# Patient Record
Sex: Female | Born: 1950 | Race: Black or African American | Hispanic: No | Marital: Single | Smoking: Former smoker
Health system: Southern US, Community
[De-identification: ages and names within clinical notes are randomized; demographics above are authoritative.]

## PROBLEM LIST (undated history)

## (undated) DIAGNOSIS — L0390 Cellulitis, unspecified: Secondary | ICD-10-CM

## (undated) DIAGNOSIS — I10 Essential (primary) hypertension: Secondary | ICD-10-CM

## (undated) DIAGNOSIS — E119 Type 2 diabetes mellitus without complications: Secondary | ICD-10-CM

## (undated) DIAGNOSIS — E7800 Pure hypercholesterolemia, unspecified: Secondary | ICD-10-CM

## (undated) DIAGNOSIS — D649 Anemia, unspecified: Secondary | ICD-10-CM

## (undated) DIAGNOSIS — I509 Heart failure, unspecified: Secondary | ICD-10-CM

## (undated) HISTORY — DX: Anemia, unspecified: D64.9

## (undated) HISTORY — DX: Heart failure, unspecified: I50.9

---

## 2005-06-18 ENCOUNTER — Emergency Department: Payer: Self-pay | Admitting: Unknown Physician Specialty

## 2005-06-27 ENCOUNTER — Emergency Department: Payer: Self-pay | Admitting: Unknown Physician Specialty

## 2005-06-28 ENCOUNTER — Ambulatory Visit: Payer: Self-pay | Admitting: Unknown Physician Specialty

## 2008-03-10 ENCOUNTER — Ambulatory Visit: Payer: Self-pay

## 2010-01-23 ENCOUNTER — Emergency Department: Payer: Self-pay | Admitting: Emergency Medicine

## 2010-04-21 ENCOUNTER — Ambulatory Visit: Payer: Self-pay | Admitting: Family Medicine

## 2011-11-03 ENCOUNTER — Ambulatory Visit: Payer: PRIVATE HEALTH INSURANCE | Primary: DO

## 2011-11-03 ENCOUNTER — Ambulatory Visit: Admit: 2011-11-03 | Payer: PRIVATE HEALTH INSURANCE | Primary: DO

## 2011-11-03 MED ORDER — midazolam (VERSED) 5 mg/mL injection
5 | INTRAMUSCULAR | Status: AC
Start: 2011-11-03 — End: ?

## 2011-11-03 MED ORDER — bupivacaine-EPINEPHrine 0.5 %-1:200,000 injection
0.5 | INTRAMUSCULAR | Status: DC | PRN
Start: 2011-11-03 — End: 2011-11-03
  Administered 2011-11-03: 21:00:00 0.5 %-1:200,000 via SUBCUTANEOUS

## 2011-11-03 MED ORDER — HYDROmorphone (DILAUDID) 1 mg/mL syringe
1 | INTRAMUSCULAR | Status: DC | PRN
Start: 2011-11-03 — End: 2011-11-03
  Administered 2011-11-03: 20:00:00 1 mg/mL via INTRAVENOUS

## 2011-11-03 MED ORDER — remifentanil (ULTIVA) 2,500 mcg in sodium chloride 0.9% (NS) 50 mL infusion
1 | INTRAVENOUS | Status: DC | PRN
Start: 2011-11-03 — End: 2011-11-03
  Administered 2011-11-03 (×2): via INTRAVENOUS

## 2011-11-03 MED ORDER — HYDROmorphone (DILAUDID) 1 mg/mL syringe 0.4-0.8 mg
1 | INTRAMUSCULAR | Status: DC | PRN
Start: 2011-11-03 — End: 2011-11-03

## 2011-11-03 MED ORDER — diphenhydrAMINE (BENADRYL) injection 25 mg
50 | INTRAMUSCULAR | Status: DC | PRN
Start: 2011-11-03 — End: 2011-11-03

## 2011-11-03 MED ORDER — propofol (DIPRIVAN) injection
10 | INTRAVENOUS | Status: DC | PRN
Start: 2011-11-03 — End: 2011-11-03
  Administered 2011-11-03 (×2): 10 mg/mL via INTRAVENOUS

## 2011-11-03 MED ORDER — ondansetron (ZOFRAN) 4 mg/2 mL injection 4 mg
4 | Freq: Four times a day (QID) | INTRAMUSCULAR | Status: DC | PRN
Start: 2011-11-03 — End: 2011-11-03
  Administered 2011-11-03: 22:00:00 4 mg via INTRAVENOUS

## 2011-11-03 MED ORDER — ondansetron (ZOFRAN) 4 mg/2 mL injection
4 | INTRAMUSCULAR | Status: AC
Start: 2011-11-03 — End: ?

## 2011-11-03 MED ORDER — fentaNYL (SUBLIMAZE) 50 mcg/mL injection
50 | INTRAMUSCULAR | Status: AC
Start: 2011-11-03 — End: ?

## 2011-11-03 MED ORDER — fentaNYL (SUBLIMAZE) 50 mcg/mL injection
50 | INTRAMUSCULAR | Status: DC | PRN
Start: 2011-11-03 — End: 2011-11-03
  Administered 2011-11-03 (×2): 50 mcg/mL via INTRAVENOUS

## 2011-11-03 MED ORDER — ondansetron (ZOFRAN) 4 mg/2 mL injection
4 | INTRAMUSCULAR | Status: DC | PRN
Start: 2011-11-03 — End: 2011-11-03
  Administered 2011-11-03: 20:00:00 4 mg/2 mL via INTRAVENOUS

## 2011-11-03 MED ORDER — Normosol-R infusion 1,000 mL
INTRAVENOUS | Status: DC
Start: 2011-11-03 — End: 2011-11-03
  Administered 2011-11-03: 16:00:00 via INTRAVENOUS

## 2011-11-03 MED ORDER — sodium chloride 0.9% 0.9 % irrigation
0.9 | Status: DC | PRN
Start: 2011-11-03 — End: 2011-11-03
  Administered 2011-11-03: 20:00:00 0.9 % irrigation

## 2011-11-03 MED ORDER — ceFAZolin (ANCEF) IV syringe 1 g
1 | INTRAVENOUS | Status: DC | PRN
Start: 2011-11-03 — End: 2011-11-03
  Administered 2011-11-03: 20:00:00 1 gram/0 mL via INTRAVENOUS

## 2011-11-03 MED ORDER — lidocaine 20 mg/mL (2 %) injection
20 | INTRAMUSCULAR | Status: DC | PRN
Start: 2011-11-03 — End: 2011-11-03
  Administered 2011-11-03: 20:00:00 20 mg/mL (2 %) via INTRAVENOUS

## 2011-11-03 MED ORDER — HYDROmorphone (DILAUDID) 1 mg/mL syringe 0.2-0.4 mg
1 | INTRAMUSCULAR | Status: DC | PRN
Start: 2011-11-03 — End: 2011-11-03

## 2011-11-03 MED ORDER — fentaNYL citrate (PF) (SUBLIMAZE) 100 mcg/2 mL (50 mcg/mL) syringe 50-100 mcg
100 | INTRAVENOUS | Status: DC | PRN
Start: 2011-11-03 — End: 2011-11-03
  Administered 2011-11-03 (×4): 100 ug via INTRAVENOUS

## 2011-11-03 MED ORDER — Normosol-R infusion
INTRAVENOUS | Status: DC | PRN
Start: 2011-11-03 — End: 2011-11-03
  Administered 2011-11-03: 19:00:00 via INTRAVENOUS

## 2011-11-03 MED ORDER — meperidine (PF) (DEMEROL) syringe 12.5 mg
50 | INTRAMUSCULAR | Status: DC | PRN
Start: 2011-11-03 — End: 2011-11-03

## 2011-11-03 MED ORDER — dexamethasone (DECADRON) injection
10 | INTRAMUSCULAR | Status: DC | PRN
Start: 2011-11-03 — End: 2011-11-03
  Administered 2011-11-03: 20:00:00 10 mg/mL via INTRAVENOUS

## 2011-11-03 MED ORDER — propofol (DIPRIVAN) injection
10 | INTRAVENOUS | Status: DC | PRN
Start: 2011-11-03 — End: 2011-11-03
  Administered 2011-11-03: 20:00:00 10 mg/mL via INTRAVENOUS

## 2011-11-03 MED ORDER — midazolam (VERSED) injection
5 | INTRAMUSCULAR | Status: DC | PRN
Start: 2011-11-03 — End: 2011-11-03
  Administered 2011-11-03: 19:00:00 5 mg/mL via INTRAVENOUS

## 2011-11-03 MED ORDER — fentaNYL (SUBLIMAZE) 50 mcg/mL injection 25-50 mcg
50 | INTRAMUSCULAR | Status: DC | PRN
Start: 2011-11-03 — End: 2011-11-03

## 2011-11-03 MED FILL — MIDAZOLAM 5 MG/ML INJECTION SOLUTION: 5 mg/mL | INTRAMUSCULAR | Qty: 1

## 2011-11-03 MED FILL — FENTANYL (PF) 50 MCG/ML INJECTION SOLUTION: 50 ug/mL | INTRAMUSCULAR | Qty: 2

## 2011-11-03 MED FILL — ONDANSETRON HCL (PF) 4 MG/2 ML INJECTION SOLUTION: 4 | INTRAMUSCULAR | Qty: 2

## 2012-04-04 ENCOUNTER — Ambulatory Visit: Payer: Self-pay | Admitting: Family Medicine

## 2013-04-07 ENCOUNTER — Ambulatory Visit: Payer: Self-pay

## 2013-08-07 ENCOUNTER — Ambulatory Visit: Payer: Self-pay | Admitting: Emergency Medicine

## 2013-08-07 ENCOUNTER — Inpatient Hospital Stay: Payer: Self-pay | Admitting: Internal Medicine

## 2013-08-07 LAB — BASIC METABOLIC PANEL
ANION GAP: 7 (ref 7–16)
BUN: 18 mg/dL (ref 7–18)
CALCIUM: 9.4 mg/dL (ref 8.5–10.1)
CHLORIDE: 100 mmol/L (ref 98–107)
CO2: 28 mmol/L (ref 21–32)
Creatinine: 0.93 mg/dL (ref 0.60–1.30)
EGFR (Non-African Amer.): >60
Glucose: 225 mg/dL — ABNORMAL HIGH (ref 65–99)
OSMOLALITY: 279 (ref 275–301)
Potassium: 3.6 mmol/L (ref 3.5–5.1)
Sodium: 135 mmol/L — ABNORMAL LOW (ref 136–145)

## 2013-08-07 LAB — CBC WITH DIFFERENTIAL/PLATELET
Basophil #: 0.2 10*3/uL — ABNORMAL HIGH (ref 0.0–0.1)
Basophil %: 1.3 %
EOS PCT: 1.4 %
Eosinophil #: 0.2 10*3/uL (ref 0.0–0.7)
HCT: 43.2 % (ref 35.0–47.0)
HGB: 13.4 g/dL (ref 12.0–16.0)
LYMPHS PCT: 19.2 %
Lymphocyte #: 2.3 10*3/uL (ref 1.0–3.6)
MCH: 23.1 pg — AB (ref 26.0–34.0)
MCHC: 31.1 g/dL — AB (ref 32.0–36.0)
MCV: 74 fL — ABNORMAL LOW (ref 80–100)
MONO ABS: 0.7 x10 3/mm (ref 0.2–0.9)
MONOS PCT: 5.6 %
Neutrophil #: 8.7 10*3/uL — ABNORMAL HIGH (ref 1.4–6.5)
Neutrophil %: 72.5 %
PLATELETS: 407 10*3/uL (ref 150–440)
RBC: 5.83 10*6/uL — AB (ref 3.80–5.20)
RDW: 14.6 % — AB (ref 11.5–14.5)
WBC: 12 10*3/uL — ABNORMAL HIGH (ref 3.6–11.0)

## 2013-08-08 LAB — CBC WITH DIFFERENTIAL/PLATELET
Basophil #: 0.1 10*3/uL (ref 0.0–0.1)
Basophil %: 1.1 %
EOS PCT: 0.9 %
Eosinophil #: 0.1 10*3/uL (ref 0.0–0.7)
HCT: 37.4 % (ref 35.0–47.0)
HGB: 12 g/dL (ref 12.0–16.0)
LYMPHS ABS: 1.7 10*3/uL (ref 1.0–3.6)
Lymphocyte %: 14 %
MCH: 23.7 pg — ABNORMAL LOW (ref 26.0–34.0)
MCHC: 32.2 g/dL (ref 32.0–36.0)
MCV: 74 fL — ABNORMAL LOW (ref 80–100)
MONO ABS: 1.1 x10 3/mm — AB (ref 0.2–0.9)
Monocyte %: 9.1 %
Neutrophil #: 8.8 10*3/uL — ABNORMAL HIGH (ref 1.4–6.5)
Neutrophil %: 74.9 %
Platelet: 322 10*3/uL (ref 150–440)
RBC: 5.09 10*6/uL (ref 3.80–5.20)
RDW: 14.4 % (ref 11.5–14.5)
WBC: 11.8 10*3/uL — AB (ref 3.6–11.0)

## 2013-08-08 LAB — BASIC METABOLIC PANEL
Anion Gap: 5 — ABNORMAL LOW (ref 7–16)
BUN: 17 mg/dL (ref 7–18)
CREATININE: 0.77 mg/dL (ref 0.60–1.30)
Calcium, Total: 8.3 mg/dL — ABNORMAL LOW (ref 8.5–10.1)
Chloride: 102 mmol/L (ref 98–107)
Co2: 29 mmol/L (ref 21–32)
EGFR (Non-African Amer.): >60
GLUCOSE: 144 mg/dL — AB (ref 65–99)
OSMOLALITY: 276 (ref 275–301)
Potassium: 3.8 mmol/L (ref 3.5–5.1)
Sodium: 136 mmol/L (ref 136–145)

## 2013-08-08 LAB — HEMOGLOBIN A1C: HEMOGLOBIN A1C: 7.4 % — AB (ref 4.2–6.3)

## 2013-08-08 LAB — TSH: THYROID STIMULATING HORM: 1.22 u[IU]/mL

## 2013-08-10 LAB — VANCOMYCIN, TROUGH: Vancomycin, Trough: 8 ug/mL — ABNORMAL LOW (ref 10–20)

## 2013-08-10 LAB — BASIC METABOLIC PANEL
Anion Gap: 4 — ABNORMAL LOW (ref 7–16)
BUN: 15 mg/dL (ref 7–18)
Calcium, Total: 8.6 mg/dL (ref 8.5–10.1)
Chloride: 105 mmol/L (ref 98–107)
Co2: 28 mmol/L (ref 21–32)
Creatinine: 0.8 mg/dL (ref 0.60–1.30)
EGFR (African American): >60
EGFR (Non-African Amer.): >60
Glucose: 125 mg/dL — ABNORMAL HIGH (ref 65–99)
Osmolality: 276 (ref 275–301)
POTASSIUM: 3.9 mmol/L (ref 3.5–5.1)
Sodium: 137 mmol/L (ref 136–145)

## 2013-08-12 LAB — CULTURE, BLOOD (SINGLE)

## 2013-08-15 LAB — WOUND CULTURE

## 2014-10-10 NOTE — Consult Note (Signed)
PATIENT NAME:  [name], [name] MR#:  [id number] DATE OF BIRTH:  1951/01/18  DATE OF CONSULTATION:  08/09/2013  CONSULTING PHYSICIAN:  Harrell Gave A. Orlan Aversa, MD  REASON FOR CONSULTATION: Right lower extremity swelling and cellulitis.   HISTORY OF PRESENT ILLNESS: Robin [name] is a pleasant 64 year old female with history of diabetes and high blood pressure, who has been noticing skin changes in her right lower extremity over the last few months. She said that over the last week or so, had skin breakdown, bullae, redness and increased swelling. Was doing fine prior to this except for the swelling. No fevers, chills, night sweats, shortness of breath, cough, chest pain, abdominal pain, nausea, vomiting, diarrhea, constipation, dysuria or hematuria.   PAST MEDICAL HISTORY:  1. Hypertension.  2. Diabetes mellitus.   HOME MEDICATIONS: Lisinopril, iron sulfate and aspirin.   ALLERGIES: No known drug allergies.   SOCIAL HISTORY: Smokes 1 pack per day. Drinks alcohol occasionally. Lives with daughter and mother.   FAMILY HISTORY: Mother with diabetes and hypertension.   REVIEW OF SYSTEMS: A 12-point review of systems was obtained. Pertinent positives and negatives as above.   PHYSICAL EXAMINATION:  VITAL SIGNS: Temperature 97.7, pulse 85, blood pressure 100/67, respirations 18, 98% on room air.  GENERAL: In no acute distress. Alert and oriented x3.  HEAD: Normocephalic, atraumatic.  EYES: No scleral icterus. No conjunctivitis.  FACE: No obvious facial trauma. Normal external nose. Normal external ears.  CHEST: Lungs clear to auscultation. Moving air well.  HEART: Regular rate and rhythm. No murmurs, rubs or gallops.  ABDOMEN: Soft, nontender, nondistended.  EXTREMITIES: Moves all extremities well. Does have right lower extremity which is approximately 2 times larger with swelling to the mid lower leg down to the foot. There are some skin changes associated with chronic venous stasis. There is also  some cellulitis and some small areas with some desquamation, but no obvious fluid collection.  NEUROLOGIC: Cranial nerves II through XII grossly intact.   LABORATORY DATA: White cell count of 11.8, was 12.0 at admission, with 75% neutrophils.   IMAGING: CT of lower extremity shows diffuse subcutaneous soft tissue stranding. No obvious fluid collections.   ASSESSMENT AND PLAN: Robin [name] is a pleasant 64 year old female with worsening right lower extremity swelling and cellulitis. No obvious surgical fluid collections to drain. Will continue to follow.   ____________________________ Glena Norfolk Bridger Pizzi, MD cal:lb D: 08/09/2013 12:13:57 ET T: 08/09/2013 12:33:46 ET JOB#: [id number]  cc: Harrell Gave A. August Gosser, MD, <Dictator> Floyde Parkins MD ELECTRONICALLY SIGNED 08/10/2013 13:13

## 2014-10-10 NOTE — H&P (Signed)
PATIENT NAME:  [name], [name] MR#:  [id number] DATE OF BIRTH:  03-11-1951  DATE OF ADMISSION:  08/07/2013  PRIMARY CARE PHYSICIAN:  Dr. [facility].   CHIEF COMPLAINT:  Skin breakdown and pain in the right lower extremity.   HISTORY OF PRESENT ILLNESS:  Robin [name] is a 64 year old African American female with a history of diabetes mellitus, hypertension, has been experiencing discoloration of the skin for the last 2 to 3 months in the right lower extremity.  Concerning this, went to the primary care physician and was given clotrimazole without much improvement.  Started to notice worsening of the skin breakdown with some small bullae.  Concerning this, went back to the primary care physician who referred her to the Emergency Department.  The patient also has a rash all over the body with extensive itching.  Denies having any fever.  Unsure about how well the patient's diabetes is controlled.   PAST MEDICAL HISTORY: 1.  Hypertension.  2.  Diabetes mellitus.   PAST SURGICAL HISTORY:  None.   ALLERGIES:  No known drug allergies.   HOME MEDICATIONS: 1.  Lisinopril, hydrochlorothiazide 10/25 mg once a day.  2.  Ferrous sulfate 325 mg once a daily. 3.  Aspirin 81 mg daily.   SOCIAL HISTORY:  Continues to smoke 1 pack a day.  Drinks alcohol occasionally.  Denies using any illicit drugs.  Lives with her daughter and mother.  Works at [employer].   FAMILY HISTORY:  Mother with diabetes mellitus and hypertension.   REVIEW OF SYSTEMS: CONSTITUTIONAL:  Denies any generalized weakness.  EYES:  No change in vision.  EARS, NOSE, THROAT:  No change in hearing.  RESPIRATORY:  No cough or shortness of breath.  CARDIOVASCULAR:  No chest pain or palpation.  GASTROINTESTINAL:  No nausea, vomiting.  GENITOURINARY:  No dysuria or hematuria.  ENDOCRINE:  Has a diagnosis of diabetes mellitus.  HEMATOLOGY:  No easy bruising or bleeding.  SKIN:  Extensive rash all over the body, worse in the lower  extremities, abdomen, under both upper extremities.  MUSCULOSKELETAL:  No joint pains and aches.  NEUROLOGIC:  No weakness or numbness in any part of the body.   PHYSICAL EXAMINATION: GENERAL:  This is a well-built, well-nourished, age-appropriate female lying down in the bed, not in distress.  VITAL SIGNS:  Temperature 97.9, pulse 98, blood pressure 95/54, respiratory rate of 16, oxygen saturation is 97% on room air.  HEENT:  Head normocephalic, atraumatic.  Eyes, no scleral icterus.  Conjunctivae normal.  Pupils equal and reactive.  Extraocular movements are intact.  Mucous membranes moist.  No pharyngeal erythema.  NECK:  Supple.  No lymphadenopathy.  No JVD.  No carotid bruit.  No thyromegaly.  CHEST:  Has no focal tenderness.  LUNGS:  Bilaterally clear to auscultation.  HEART:  S1, S2 regular.  No murmurs are heard.  ABDOMEN:  Bowel sounds plus.  Soft, nontender, nondistended.  EXTREMITIES:  Right lower extremity with hyperkeratotic area of the ankle with multiple fissures, increased swelling and redness of the lower part of the leg.  Multiple extensive rash all over the body concerning about scabies.   NEUROLOGIC:  The patient is alert, oriented to place, person and time.  Cranial nerves II through XII intact.  Motor 5 by 5 in upper and lower extremities.  MUSCULOSKELETAL:  Good range of motion in all the extremities.   LABORATORY DATA:  CBC, WBC of 12, hemoglobin 13.4, platelet count of 404.   CT of  the ankle, diffuse subcutaneous soft tissue stranding suggestive of cellulitis.     BMP shows a glucose of 225, the rest of all the values are within normal limits.   ASSESSMENT AND PLAN:  Robin [name] is a 64 year old female who comes to the Emergency Department with cellulitis of the right lower extremity.  1.  Cellulitis of the right lower extremity, concerned about from the scratching.  Concerned about the patient has underlying scabies.  However, we will obtain dermatology consult.  We  will continue with vancomycin and Zosyn for now.  We will also give her one dose of  lotion all over the body.  We will also check the HIV status.  2.  Diabetes mellitus.  We will obtain hemoglobin A1c.  Keep the patient on sliding scale insulin.  3.  Hypertension:  Currently the patient has low-normal blood pressure.  Hold lisinopril, hydrochlorothiazide.  4.  Keep the patient on deep vein thrombosis prophylaxis with Lovenox.   TIME SPENT:  50 minutes.     ____________________________ Monica Becton, MD pv:ea D: 08/08/2013 00:04:51 ET T: 08/08/2013 00:40:06 ET JOB#: [id number]  cc: Monica Becton, MD, <Dictator> Monica Becton MD ELECTRONICALLY SIGNED 08/21/2013 1:24

## 2014-10-10 NOTE — Discharge Summary (Signed)
PATIENT NAME:  [name], [name] MR#:  [name] DATE OF BIRTH:  [birth year]  DATE OF ADMISSION:  08/07/2013  DATE OF DISCHARGE:  08/10/2013  ADMISSION DIAGNOSIS: Cellulitis of the right lower extremity.   DISCHARGE DIAGNOSES:  1.  Cellulitis of the right lower extremity.  2.  Diabetes.  3.  Hypertension.   CONSULTATIONS: Dr. Rexene Edison from Surgery.   IMAGING: The patient had a CT scan which showed evidence of cellulitis. No evidence of osteomyelitis.   LABS:  Discharge sodium 137, potassium 3.9, chloride 105, bicarb 28, BUN 15, creatinine 0.80, glucose 125. TSH 1.22. White blood cells 11, hemoglobin 12, hematocrit 37.4, platelets 324. Blood cultures negative to date. Hemoglobin A1c is 7.4.  HOSPITAL COURSE: This is a very pleasant, 64 year old female who presented with cellulitis of her right lower extremity. For further details, please refer to the H and P.   1.  Cellulitis. Her initial CT did not show evidence of osteomyelitis. I did consult Surgery for their evaluation and opinion. At this time, she has no surgical needs or indications for surgery. The cellulitis has somewhat improved, with decreasing erythema, decrease in swelling. She still has some swelling of that right lower extremity. There are some notable skin changes associated with the chronic venous stasis as well, and there is a small area of desquamation, but no obvious fluid collection at discharge. She was transitioned from Zosyn to Bactrim and Keflex at discharge.   2.  Diabetes. The patient has a history of diabetes, but apparently not on any medications. Her A1c is 7.4. I started her on glipizide. She will need outpatient follow up at the [facility] clinic. We have also consulted the home health nurse to help with her diabetes management.   3. Hypertension. Her blood pressures were actually low-normal here, so we stopped her medications. This needs to be followed up by her primary care physician as well as the home health  nurse.   4.  Scabies. It was thought that patient may have some scabies on her arms. She should see a dermatologist. We recommend that she see a dermatologist on Monday.   DISCHARGE MEDICATIONS: 1.  Aspirin 81 mg daily.  2.  Keflex 500 mg q. 8 hours x 14 days.  3.  Bactrim 1 tablet p.o. b.i.d. x 14 days. 4.  Glipizide 5 mg daily for diabetes.  5.  Santyl apply to affected area daily for her wound. 6.  Ferrous sulfate 325 mg daily.   INSTRUCTIONS: Discharge home with home health. Discharge activity as tolerated.  Discharge diet:  Low sodium ADA diet. Follow up in one week with [facility] for diabetes and blood pressure control.   TIME SPENT:  35 minutes.  The patient was medically stable for discharge.     ____________________________ Weaver Tweed P. Benjie Karvonen, MD spm:mr D: 08/10/2013 13:22:00 ET T: 08/10/2013 19:54:18 ET JOB#: [id number]  cc: Jiselle Sheu P. Benjie Karvonen, MD, <Dictator> Randall P Elliona Doddridge MD ELECTRONICALLY SIGNED 08/10/2013 22:10

## 2015-12-01 ENCOUNTER — Ambulatory Visit
Admission: RE | Admit: 2015-12-01 | Discharge: 2015-12-01 | Disposition: A | Discharge status: Home / Self Care | Payer: Self-pay | Source: Ambulatory Visit | Attending: Internal Medicine | Admitting: Internal Medicine

## 2015-12-01 ENCOUNTER — Ambulatory Visit: Payer: Self-pay | Attending: Internal Medicine

## 2015-12-01 VITALS — Ht 59.06 in | Wt 199.3 lb

## 2015-12-01 DIAGNOSIS — Z0000 Encounter for general adult medical examination without abnormal findings: Secondary | ICD-10-CM

## 2015-12-01 NOTE — Progress Notes (Signed)
Subjective:     Patient ID: Robin [name], female   DOB: [birth year], 65 y.o.   MRN: [id number]  HPI   Review of Systems     Objective:   Physical Exam  Pulmonary/Chest: Right breast exhibits no inverted nipple, no mass, no nipple discharge, no skin change and no tenderness. Left breast exhibits no inverted nipple, no mass, no nipple discharge, no skin change and no tenderness. Breasts are asymmetrical.  Left breast smaller than right       Assessment:     65 year old patient presents for [facility] clinic visit .  Patient screened, and meets BCCCP eligibility.  Patient does not have insurance, Medicare or Medicaid.  Handout given on Affordable Care Act.  Instructed patient on breast self-exam using teach back method.  CBE unremarkable.  No mass or lump palpated. Patient reports her older sister was diagnosed with breast cancer in her early 37's, and is deceased.      Plan:  Sent for bilateral screening mammogram.

## 2015-12-07 NOTE — Progress Notes (Signed)
Letter mailed from [facility] to notify of normal mammogram results.  Patient to return in one year for annual screening.  Copy to [facility]. 

## 2016-06-22 ENCOUNTER — Ambulatory Visit (INDEPENDENT_AMBULATORY_CARE_PROVIDER_SITE_OTHER): Payer: Self-pay

## 2016-06-22 ENCOUNTER — Ambulatory Visit
Admission: EM | Admit: 2016-06-22 | Discharge: 2016-06-22 | Disposition: A | Discharge status: Home / Self Care | Payer: Self-pay | Attending: Family Medicine | Admitting: Family Medicine

## 2016-06-22 DIAGNOSIS — J209 Acute bronchitis, unspecified: Secondary | ICD-10-CM

## 2016-06-22 HISTORY — DX: Essential (primary) hypertension: I10

## 2016-06-22 HISTORY — DX: Type 2 diabetes mellitus without complications: E11.9

## 2016-06-22 HISTORY — DX: Cellulitis, unspecified: L03.90

## 2016-06-22 HISTORY — DX: Pure hypercholesterolemia, unspecified: E78.00

## 2016-06-22 MED ORDER — ALBUTEROL SULFATE HFA 108 (90 BASE) MCG/ACT IN AERS: 2.0000 | INHALATION_SPRAY | RESPIRATORY_TRACT | 1 refills | Status: AC | PRN

## 2016-06-22 MED ORDER — AZITHROMYCIN 500 MG PO TABS: ORAL_TABLET | ORAL | 0 refills | Status: DC

## 2016-06-22 MED ORDER — IPRATROPIUM-ALBUTEROL 0.5-2.5 (3) MG/3ML IN SOLN
3.0000 mL | Freq: Four times a day (QID) | RESPIRATORY_TRACT | Status: DC
  Administered 2016-06-22 (×2): 3 mL via RESPIRATORY_TRACT

## 2016-06-22 MED ORDER — IPRATROPIUM-ALBUTEROL 0.5-2.5 (3) MG/3ML IN SOLN
3.0000 mL | Freq: Once | RESPIRATORY_TRACT | Status: AC
  Administered 2016-06-22: 3 mL via RESPIRATORY_TRACT

## 2016-06-22 MED ORDER — PREDNISONE 10 MG (21) PO TBPK: ORAL_TABLET | ORAL | 0 refills | Status: DC

## 2016-06-22 MED ORDER — HYDROCOD POLST-CPM POLST ER 10-8 MG/5ML PO SUER: 5.0000 mL | Freq: Two times a day (BID) | ORAL | 0 refills | Status: DC | PRN

## 2016-06-22 MED ORDER — METHYLPREDNISOLONE SODIUM SUCC 125 MG IJ SOLR
125.0000 mg | Freq: Once | INTRAMUSCULAR | Status: AC
  Administered 2016-06-22: 125 mg via INTRAMUSCULAR

## 2016-06-22 NOTE — ED Provider Notes (Signed)
[facility]    CSN: [id number] Arrival date & time: 06/22/16  1423     History   Chief Complaint Chief Complaint  Patient presents with  . Wheezing  . Shortness of Breath    HPI Robin [name] is a 66 y.o. female.   Patient is a 66 year old black female with no previous history according to her of COPD or asthma. She reports having occasional episode of bronchitis but no other pulmonary diagnosis. She does have a history of cellulitis diabetes hyperlipidemia and hypertension. She does smoke though she states the last 2 days she stopped name smoke much. No known drug allergies. No pertinent past history of surgeries by her sister has had breast cancer. She reports increased shortness of breath over the last few days and came in to be seen and evaluated   The history is provided by the patient. No language interpreter was used.  Wheezing  Severity:  Severe Duration:  4 days Timing:  Constant Progression:  Worsening Chronicity:  New Context: fumes and smoke exposure   Relieved by:  Nothing Worsened by:  Activity, fumes and smoke exposure Ineffective treatments:  None tried Associated symptoms: cough and shortness of breath   Shortness of Breath  Associated symptoms: cough and wheezing     Past Medical History:  Diagnosis Date  . Cellulitis   . Diabetes mellitus without complication ([place])   . High cholesterol   . Hypertension     There are no active problems to display for this patient.   History reviewed. No pertinent surgical history.  OB History    No data available       Home Medications    Prior to Admission medications   Medication Sig Start Date End Date Taking? Authorizing Provider  aspirin EC 81 MG tablet Take 81 mg by mouth daily.   Yes Historical Provider, MD  atorvastatin (LIPITOR) 10 MG tablet Take 10 mg by mouth daily.   Yes Historical Provider, MD  lisinopril-hydrochlorothiazide (PRINZIDE,ZESTORETIC) 10-12.5 MG tablet Take 1 tablet  by mouth daily.   Yes Historical Provider, MD  albuterol (PROVENTIL HFA;VENTOLIN HFA) 108 (90 Base) MCG/ACT inhaler Inhale 2 puffs into the lungs every 4 (four) hours as needed for wheezing or shortness of breath. 06/22/16   Frederich Cha, MD  azithromycin (ZITHROMAX) 500 MG tablet 1 tablet daily for 5 days 06/22/16   Frederich Cha, MD  chlorpheniramine-HYDROcodone [facility] ER) 10-8 MG/5ML SUER Take 5 mLs by mouth every 12 (twelve) hours as needed for cough. 06/22/16   Frederich Cha, MD  predniSONE (STERAPRED UNI-PAK 21 TAB) 10 MG (21) TBPK tablet Sig 6 tablet day 1, 5 tablets day 2, 4 tablets day 3,,3tablets day 4, 2 tablets day 5, 1 tablet day 6 take all tablets orally 06/22/16   Frederich Cha, MD    Family History Family History  Problem Relation Age of Onset  . Breast cancer Sister     Social History Social History  Substance Use Topics  . Smoking status: Current Every Day Smoker    Packs/day: 1.00    Types: Cigarettes  . Smokeless tobacco: Never Used  . Alcohol use Yes     Comment: rare     Allergies   Patient has no known allergies.   Review of Systems Review of Systems  Respiratory: Positive for cough, shortness of breath and wheezing.   All other systems reviewed and are negative.    Physical Exam Triage Vital Signs ED Triage Vitals [06/22/16 1434]  Enc Vitals Group     BP (!) 162/110     Pulse Rate 73     Resp (!) 30     Temp 98.2 F (36.8 C)     Temp Source Tympanic     SpO2 97 %     Weight 200 lb (90.7 kg)     Height 5' (1.524 m)     Head Circumference      Peak Flow      Pain Score      Pain Loc      Pain Edu?      Excl. in [place]?    No data found.   Updated Vital Signs BP (!) 162/110 (BP Location: Right Arm) Comment: has not taken B/P meds yet today  Pulse 73   Temp 98.2 F (36.8 C) (Tympanic)   Resp (!) 28   Ht 5' (1.524 m)   Wt 200 lb (90.7 kg)   SpO2 96%   BMI 39.06 kg/m   Visual Acuity Right Eye Distance:   Left Eye Distance:     Bilateral Distance:    Right Eye Near:   Left Eye Near:    Bilateral Near:     Physical Exam  Constitutional: She is oriented to person, place, and time. She appears well-developed and well-nourished. She appears distressed.  HENT:  Head: Normocephalic and atraumatic.  Eyes: EOM are normal. Pupils are equal, round, and reactive to light.  Neck: Normal range of motion. Neck supple.  Cardiovascular: Normal rate, regular rhythm and normal heart sounds.   Pulmonary/Chest: She is in respiratory distress. She has wheezes.  Musculoskeletal: Normal range of motion. She exhibits no edema or deformity.  Lymphadenopathy:    She has cervical adenopathy.  Neurological: She is alert and oriented to person, place, and time.  Skin: Skin is warm.  Psychiatric: She has a normal mood and affect.  Vitals reviewed.    UC Treatments / Results  Labs (all labs ordered are listed, but only abnormal results are displayed) Labs Reviewed - No data to display  EKG  EKG Interpretation None       Radiology Dg Chest 2 View  Result Date: 06/22/2016 CLINICAL DATA:  Cough. EXAM: CHEST  2 VIEW COMPARISON:  No recent . FINDINGS: Patient rotated to the left. Mediastinum and hilar structures are normal. Heart size normal. No focal infiltrate . Mild right apical pleural thickening noted most consistent scarring. No pleural effusion or pneumothorax. No acute bony abnormality identified . IMPRESSION: No acute cardiopulmonary disease identified. Electronically Signed   By: Marcello Moores  Register   On: 06/22/2016 15:01    Procedures Procedures (including critical care time)  Medications Ordered in UC Medications  ipratropium-albuterol (DUONEB) 0.5-2.5 (3) MG/3ML nebulizer solution 3 mL (3 mLs Nebulization Given 06/22/16 1517)  methylPREDNISolone sodium succinate (SOLU-MEDROL) 125 mg/2 mL injection 125 mg (125 mg Intramuscular Given 06/22/16 1449)  ipratropium-albuterol (DUONEB) 0.5-2.5 (3) MG/3ML nebulizer solution 3 mL  (3 mLs Nebulization Given 06/22/16 1450)     Initial Impression / Assessment and Plan / UC Course  I have reviewed the triage vital signs and the nursing notes.  Pertinent labs & imaging results that were available during my care of the patient were reviewed by me and considered in my medical decision making (see chart for details).  Clinical Course    Patient with marked bronchospasms while here. She also had a low pulse ox actually one time and 87 but was not recorded WHEN it hit the hip  to that low. She'll be given 2 DuoNeb treatments here Solu-Medrol 125. Chest x-ray was negative. This point time will treat for acute bronchitis with bronchospasms water she stop smoking work note given for today and tomorrow but if she gets worse she'll have to go to emergency room today for possible admission. Follow-up with PCP and she does follow up with PCP next week I strongly suggest that she talks about being evaluated for COPD   Final Clinical Impressions(s) / UC Diagnoses   Final diagnoses:  Acute bronchitis with bronchospasm    New Prescriptions New Prescriptions   ALBUTEROL (PROVENTIL HFA;VENTOLIN HFA) 108 (90 BASE) MCG/ACT INHALER    Inhale 2 puffs into the lungs every 4 (four) hours as needed for wheezing or shortness of breath.   AZITHROMYCIN (ZITHROMAX) 500 MG TABLET    1 tablet daily for 5 days   CHLORPHENIRAMINE-HYDROCODONE (TUSSIONEX PENNKINETIC ER) 10-8 MG/5ML SUER    Take 5 mLs by mouth every 12 (twelve) hours as needed for cough.   PREDNISONE (STERAPRED UNI-PAK 21 TAB) 10 MG (21) TBPK TABLET    Sig 6 tablet day 1, 5 tablets day 2, 4 tablets day 3,,3tablets day 4, 2 tablets day 5, 1 tablet day 6 take all tablets orally     Note: This dictation was prepared with Dragon dictation along with smaller phrase technology. Any transcriptional errors that result from this process are unintentional.   Frederich Cha, MD 06/22/16 1538

## 2016-06-22 NOTE — ED Notes (Signed)
Post nebulizer x 2 lungs sounds continue to reveal wheezes throughout. Moist cough noted

## 2016-06-22 NOTE — ED Triage Notes (Signed)
Pt with cough x 3 days productive of yellow sputum, audible wheezing. No fever.

## 2016-06-22 NOTE — Discharge Instructions (Signed)
Need to be evaluated by PCP for COPD. Strongly recommend stop smoking.

## 2016-06-27 NOTE — Telephone Encounter (Signed)
Future Appointments  Date Time Provider Department Center   07/11/2016 3:30 PM Carmell Austria, DO [id number]         Contact Name: [place]    Phone number:  [phone]    Best time to reach them: any    Insurance: moda     If they have OHP which plan?  If they have OHP what provider are they assigned to?    If TCM New Patient Appt: Who is calling to make the appt?**    Inform patient that the Access Coordinator will be calling them prior to their appointment to get them fully registered**    Patient is also going to be seen by either existing PCP or Urgent Care to address immediate health care needs.

## 2016-06-28 NOTE — Telephone Encounter (Signed)
Will contact patient closer to appointment date.

## 2016-06-29 NOTE — Telephone Encounter (Signed)
Abstracted

## 2016-07-11 ENCOUNTER — Encounter: Attending: DO | Primary: DO

## 2016-07-24 ENCOUNTER — Ambulatory Visit: Admit: 2016-07-24 | Payer: PRIVATE HEALTH INSURANCE | Attending: DO | Primary: DO

## 2016-07-24 DIAGNOSIS — F3341 Major depressive disorder, recurrent, in partial remission: Secondary | ICD-10-CM

## 2016-07-24 NOTE — Progress Notes (Signed)
[name] is a 66 y.o. female seen today for Establish Care and Depression  .    SUBJECTIVE:  HPI  [name] is here today to establish care, she reports a history of depression. She's been taking Nardal for her depression since her children were young. Her previous PCP was Dr. Fredrich Birks in [place]. She feels that her dosage may be too much. She reports feeling foggy. Dr. Janee Morn (psych) dx'd with severe depression and started on SSRI's and did not work so placed on Nardil. Has done well, but feels foggy on 3 bid and would like to go back to 2 qam and 2-3 qpm.  She says she is up to date on her pap and mammo. She is refusing to be referred at this time for her colonoscopy.     The following portions of the patient's history were reviewed and updated as appropriate: allergies, current medications, past family history, past medical history, past surgical history and problem list.    Current Outpatient Prescriptions on File Prior to Visit   Medication Sig Dispense Refill   . lovastatin (MEVACOR) 40 MG tablet Take 40 mg by mouth every night at bedtime.      . phenelzine (NARDIL) 15 mg tablet Take 30 mg by mouth. Currently taking 3 15 mg tablets in the morning and 3 15 ng tablets at night.       No current facility-administered medications on file prior to visit.        Allergies   Allergen Reactions   . Nitrous Oxide Nausea And Vomiting   . Codeine Nausea And Vomiting       Past Medical History:   Diagnosis Date   . Bronchitis    . Depression     years ago   . GERD (gastroesophageal reflux disease)     intermittent-no Rx   . Heart murmur     ?   [name]        Past Surgical History:   Procedure Laterality Date   . DILATION AND CURETTAGE OF UTERUS     . PONV after dental     . PROCEDURE Left 11/03/2011    Procedure: OPEN REDUCTION INTERNAL FIXATION, BIMALLEOLAR  ANKLE;  Surgeon: [name], MD;  Location: [facility] OR;  Service: Orthopedics;  Laterality: Left;   . tonsils         Family History   Problem Relation Age  of Onset   . Alzheimer's disease Mother    . Arthritis Father    . Lupus Sister    . Diabetes Brother    . No Known Health Problems Sister        Social History     Social History Main Topics   . Smoking status: Current Every Day Smoker     Packs/day: 0.50     Years: 25.00   . Smokeless tobacco: Never Used   . Alcohol use No   . Drug use: No   . Sexual activity: Yes     Partners: Male     Social History   . Marital status: Married     Spouse name: [name]   . Number of children: 4   . Years of education: N/A     Other Topics Concern   . Caffeine Concern Yes     occasional Pepsi   . Exercise No       REVIEW OF SYSTEMS:  Review of Systems   Constitutional: Positive for malaise/fatigue.   Respiratory: Positive  for cough. Negative for hemoptysis, sputum production, shortness of breath and wheezing.    Cardiovascular: Negative for chest pain and leg swelling.   Gastrointestinal: Negative for abdominal pain and heartburn.   Neurological: Negative for sensory change, weakness and headaches.   Psychiatric/Behavioral: Positive for depression. Negative for substance abuse and suicidal ideas. The patient is not nervous/anxious and does not have insomnia.        OBJECTIVE:  BP 104/70   Pulse 93   Temp 36.7 ?C (98.1 ?F) (Oral)   Ht 5' 3 (1.6 m)   Wt 186 lb 9.6 oz (84.6 kg)   SpO2 96%   BMI 33.05 kg/m?   Body mass index is 33.05 kg/m?[name]    PHYSICAL EXAM:  Gen: no acute distress, nontoxic, alert, oriented, well nourished, obese.  Heent: head normocephalic w/o trauma,  PERRLA, EOMI, conjunctiva and sclera intact w/o d/c. EAC's patent w/o cerumen impaction. TM's intact.  No nasal d/c, turbinate hypertrophy or epistaxis. No facial tenderness over sinuses. No oral lesions.Pharynx non erythematous w/o mass, tonsillar enlargement or d/c.  Neck: supple without nodes, JVD, bruits, or thyromegaly.  Resp: good air movement, no retractions, symmetrical breath sounds, without rales, rhonchi, or wheezing  CV: RRR, normal S1, S2; no murmur  or gallop  Abd: nontender, good BS, no R/G/R. No masses, hepatomegaly or splenomegaly.  Ext: no cyanosis, edema, or varicose veins. Good pulses throughout.  Psych: normal affect with  depression w/o anxiety. No suicidal or homicidal ideation or plan.   Skin: no lesions or rash with good turgor.      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, in partial remission (CMS/HCC)  (primary encounter diagnosis)  Plan: 25-OH Vitamin D Total D2+D3 -Routine, Folate         -Routine, Thyroid Stimulating Hormone -Routine,        CBC with Auto Differential -Routine,         Comprehensive Metabolic Panel -Routine    (R53.83) Other fatigue    (E78.2) Mixed hyperlipidemia  Plan: Coronary Risk Lipid Panel -Routine, Thyroid         Stimulating Hormone -Routine, CBC with Auto         Differential -Routine, Comprehensive Metabolic         Panel -Routine    (F17.200) Tobacco dependence    (E66.09,  Z68.33) Class 1 obesity due to excess calories without serious comorbidity with body mass index (BMI) of 33.0 to 33.9 in adult    Reduce Nardil and warnings. Pt elects to stay with this class of med due to intolerances and ineffectiveness of prior SSRI and other trial. Psych eval. Fasting labs. Recommended discontinue smoking and/or chewing and reviewed in detail the D/C options and risks for not quitting tobacco products.Plant-based diet, exercise, and weight loss recommended. Warnings regarding increased morbidity and mortality with being overweight. Greater than 50% of 35 minute office visit spent in counseling and treatment plan implementation, medication review and management, and risks and alternatives to care of conditions.    I had a thoughtful and careful discussion regarding the issues today. All questions were answered. I educated and reassured. I encouraged follow up as needed.       This note was transcribed using speech recognition software. Please contact us for clarification if any questions arise relating  to the wording of this document.

## 2016-10-24 MED ORDER — lovastatin (MEVACOR) 40 MG tablet
40 | ORAL_TABLET | Freq: Every evening | ORAL | 3 refills | 90.00000 days | Status: DC
Start: 2016-10-24 — End: 2017-05-24

## 2017-01-05 MED ORDER — phenelzine (NARDIL) 15 mg tablet
15 | ORAL_TABLET | ORAL | 5 refills | Status: DC
Start: 2017-01-05 — End: 2017-07-19

## 2017-01-05 NOTE — Telephone Encounter (Signed)
Faxed

## 2017-01-05 NOTE — Telephone Encounter (Signed)
Medication Being Requested:  phenelzine (NARDIL) 15 mg tablet generic only     Send To Pharmacy    [facility] - [place], [place] - - [place], [place] - [address]  [address] [place] [address]  Phone: [phone] Fax: [phone]      How many Days Remaining of medication: 1-2 days left    Additional Information:   Patient called in refill request as her previous request was sent to the wrong provider. Patient is about out. Please call if questions at [phone] (home) Okay to leave a detailed message.       **Informed patient that it may take up to 48-72 hours to fill prescription.**    Future Appointments:  No future appointments.

## 2017-01-25 ENCOUNTER — Other Ambulatory Visit: Admit: 2017-01-25 | Discharge: 2017-01-25 | Payer: PRIVATE HEALTH INSURANCE | Primary: DO

## 2017-01-25 DIAGNOSIS — F3341 Major depressive disorder, recurrent, in partial remission: Secondary | ICD-10-CM

## 2017-01-25 LAB — CBC WITH AUTO DIFFERENTIAL
Basophils %: 1 % (ref 0–2)
Basophils, Absolute: 0.1 10*3/ÂµL (ref 0.0–0.2)
Eosinophils %: 2 % (ref 0–7)
Eosinophils, Absolute: 0.2 10*3/ÂµL (ref 0.0–0.7)
HCT: 39.1 % (ref 37.0–48.0)
Hemoglobin: 13 g/dL (ref 12.0–16.0)
Lymphocytes %: 30 % (ref 25–45)
Lymphocytes, Absolute: 2.1 10*3/ÂµL (ref 1.1–4.3)
MCH: 28.3 pg (ref 27.0–34.0)
MCHC: 33.4 g/dL (ref 32.0–36.0)
MCV: 84.7 fL (ref 81.0–99.0)
MPV: 8.3 fL (ref 7.4–10.4)
Monocytes %: 7 % (ref 0–12)
Monocytes, Absolute: 0.5 10*3/ÂµL (ref 0.0–1.2)
Neutrophils %: 59 % (ref 35–70)
Neutrophils, Absolute: 4.1 10*3/ÂµL (ref 1.6–7.3)
Platelet Count: 230 10*3/ÂµL (ref 150–400)
RBC: 4.61 10*6/ÂµL (ref 4.20–5.40)
RDW: 14.6 % — ABNORMAL HIGH (ref 11.5–14.5)
WBC: 6.9 10*3/ÂµL (ref 4.8–10.8)

## 2017-01-25 LAB — CORONARY RISK LIPID PANEL
Cholesterol, HDL: 38 mg/dL — ABNORMAL LOW (ref >=40)
Cholesterol: 200 mg/dL — ABNORMAL HIGH (ref <200)
LDL Calculated: 133 mg/dL — ABNORMAL HIGH (ref <100)
Triglyceride: 146 mg/dL (ref 30–149)

## 2017-01-25 LAB — COMPREHENSIVE METABOLIC PANEL
ALT - Alanine Aminotransferase: 18 IU/L (ref 7–52)
AST - Aspartate Aminotransferase: 23 IU/L (ref 8–39)
Albumin/Globulin Ratio: 1.5 (ref >0.9)
Albumin: 4.4 g/dL (ref 3.5–5.0)
Alkaline Phosphatase: 100 IU/L (ref 34–104)
Anion Gap: 9 mmol/L (ref 4–13)
BUN: 11 mg/dL (ref 6–23)
Bilirubin Total: 0.5 mg/dL (ref 0.3–1.2)
CO2 - Carbon Dioxide: 25 mmol/L (ref 21–31)
Calcium: 9.6 mg/dL (ref 8.6–10.3)
Chloride: 108 mmol/L (ref 98–111)
Creatinine: 0.7 mg/dL (ref 0.55–1.10)
GFR Estimate: >60 mL/min/{1.73_m2} (ref >=60)
Globulin: 2.9 g/dL (ref 2.2–3.7)
Glucose: 101 mg/dL — ABNORMAL HIGH (ref 80–99)
Potassium: 3.9 mmol/L (ref 3.5–5.1)
Protein Total: 7.3 g/dL (ref 6.0–8.0)
Sodium: 142 mmol/L (ref 135–143)

## 2017-01-25 LAB — TSH: TSH - Thyroid Stimulating Hormone: 1.72 u[IU]/mL (ref 0.45–5.33)

## 2017-01-25 LAB — FOLATE: Folate: >20 ng/mL (ref 5.9–24.8)

## 2017-01-25 LAB — 25-OH HYDROXY VITAMIN D, CALCIFEROL, TOTAL OF D2 & D3: Vitamin D, 25-OH Total: 17.6 ng/mL — ABNORMAL LOW (ref 30.0–100.0)

## 2017-01-26 MED ORDER — cholecalciferol, vitamin D3, 5,000 unit capsule
125 | ORAL_CAPSULE | Freq: Every day | ORAL | 0 refills | 43.00000 days | Status: AC
Start: 2017-01-26 — End: 2023-12-26

## 2017-01-26 NOTE — Telephone Encounter (Signed)
-----   Message from Carmell Austria, DO sent at 01/26/2017  1:54 PM PDT -----  Chol up and vit D low. Reduce fat intake and increase Vit D3 to 5000 IU/D and repeat both in 3 mos. Rest 1 yr.

## 2017-01-26 NOTE — Telephone Encounter (Signed)
I called and informed patient of results. She will start vitamin D. She has an appt next week. Placed reminders.

## 2017-01-30 ENCOUNTER — Ambulatory Visit: Admit: 2017-01-30 | Payer: PRIVATE HEALTH INSURANCE | Attending: DO | Primary: DO

## 2017-01-30 DIAGNOSIS — E782 Mixed hyperlipidemia: Secondary | ICD-10-CM

## 2017-01-30 NOTE — Progress Notes (Signed)
[name] is a 66 y.o. female seen today for Depression; Review Medication; and Lab Results  .    SUBJECTIVE:  HPI    [name] presents today to follow up depression, review labs and medication. Labs discussed in detail. Nardil seems to be working. Having some stressors, but handling. No interest in [facility] after recommendation to see him.    The following portions of the patient's history were reviewed and updated as appropriate: allergies, current medications, past family history, past medical history, past surgical history and problem list.    Current Outpatient Prescriptions on File Prior to Visit   Medication Sig Dispense Refill   . cholecalciferol, vitamin D3, 5,000 unit capsule Take 1 capsule by mouth daily. 1 capsule 0   . lovastatin (MEVACOR) 40 MG tablet Take 1 tablet by mouth every night at bedtime. 90 tablet 3   . phenelzine (NARDIL) 15 mg tablet Take 3 tabs in the morning and 3 tabs at night 180 tablet 5     No current facility-administered medications on file prior to visit.        Allergies   Allergen Reactions   . Nitrous Oxide Nausea And Vomiting   . Codeine Nausea And Vomiting       Past Medical History:   Diagnosis Date   . Bronchitis    . Depression     years ago   . GERD (gastroesophageal reflux disease)     intermittent-no Rx   . Heart murmur     ?   [name]        Past Surgical History:   Procedure Laterality Date   . DILATION AND CURETTAGE OF UTERUS     . PONV after dental     . PROCEDURE Left 11/03/2011    Procedure: OPEN REDUCTION INTERNAL FIXATION, BIMALLEOLAR  ANKLE;  Surgeon: [name], MD;  Location: [facility] OR;  Service: Orthopedics;  Laterality: Left;   . tonsils         Family History   Problem Relation Age of Onset   . Alzheimer's disease Mother    . Arthritis Father    . Lupus Sister    . Diabetes Brother    . No Known Health Problems Sister        Social History     Social History Main Topics   . Smoking status: Current Every Day Smoker     Packs/day: 0.50     Years: 25.00   .  Smokeless tobacco: Never Used   . Alcohol use No   . Drug use: No   . Sexual activity: Yes     Partners: Male     Social History   . Marital status: Married     Spouse name: [name]   . Number of children: 4   . Years of education: N/A     Other Topics Concern   . Caffeine Concern Yes     occasional Pepsi   . Exercise No       REVIEW OF SYSTEMS:  Review of Systems   HENT: Negative for congestion.    Respiratory: Negative for cough.    Gastrointestinal: Negative.    Genitourinary: Negative.    Musculoskeletal: Negative for back pain, joint pain and neck pain.   Neurological: Negative for dizziness and headaches.   Psychiatric/Behavioral: Positive for depression. The patient is nervous/anxious.        OBJECTIVE:  BP 128/84   Pulse 89   Temp 37.2 ?C (98.9 ?  F) (Oral)   Ht 5' 3 (1.6 m)   Wt 186 lb (84.4 kg)   SpO2 97%   BMI 32.95 kg/m?   Body mass index is 32.95 kg/m?[name]    PHYSICAL EXAM:  Gen: no acute distress, nontoxic, alert, oriented, well nourished, obese.  Neck: supple without nodes, JVD, bruits, or thyromegaly.  Resp: good air movement, no retractions, symmetrical breath sounds, without rales, rhonchi, or wheezing  CV: RRR, normal S1, S2; no murmur or gallop  Ext: no cyanosis, edema, or varicose veins. Good pulses throughout.  Neuro: CN II-XII intact, A&O x 3, no sensory deficits,   Psych: normal affect with depression w/oanxiety. No suicidal or homicidal ideation or plan.   Skin: no lesions or rash with good turgor.      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 Warnings regarding increased morbidity and mortality with being  overweight.Recommended discontinue smoking and/or chewing and reviewed in detail the D/C options and risks for not quitting tobacco products. Vit D 5000 IU/d. Serial fasting labs. Continue Nardil with warnings.    I had a thoughtful and careful discussion regarding the issues today. All questions were answered. I educated and reassured. I encouraged follow up as needed.       This note was transcribed using speech recognition software. Please contact us for clarification if any questions arise relating to the wording of this document.

## 2017-04-30 NOTE — Telephone Encounter (Signed)
-----   Message from [place], [place] sent at 01/26/2017  2:00 PM PDT -----  Regarding: labs  Lipid and Vit D

## 2017-04-30 NOTE — Telephone Encounter (Signed)
Called [name] and informed her of labs that are due. She will be in sometime this week to complete them.

## 2017-05-09 ENCOUNTER — Institutional Professional Consult (permissible substitution): Admit: 2017-05-09 | Payer: PRIVATE HEALTH INSURANCE | Primary: DO

## 2017-05-09 ENCOUNTER — Other Ambulatory Visit: Admit: 2017-05-09 | Discharge: 2017-05-09 | Payer: PRIVATE HEALTH INSURANCE | Primary: DO

## 2017-05-09 DIAGNOSIS — Z23 Encounter for immunization: Secondary | ICD-10-CM

## 2017-05-09 DIAGNOSIS — E559 Vitamin D deficiency, unspecified: Secondary | ICD-10-CM

## 2017-05-09 LAB — CORONARY RISK LIPID PANEL REFLEX DIRECT LDL
Cholesterol, HDL: 42 mg/dL (ref >=40)
Cholesterol: 184 mg/dL (ref <200)
LDL Calculated: 108 mg/dL — ABNORMAL HIGH (ref <100)
Triglyceride: 169 mg/dL — ABNORMAL HIGH (ref 30–149)

## 2017-05-09 LAB — 25-OH HYDROXY VITAMIN D, CALCIFEROL, TOTAL OF D2 & D3: Vitamin D, 25-OH Total: 32.3 ng/mL (ref 30.0–100.0)

## 2017-05-09 NOTE — Progress Notes (Signed)
Immunizations     Name Date Dose VIS Date Route    Flu Quadrivalent Injectable (PF) 05/09/2017  2:22 PM 0.5 mL 01/23/2014 Intramuscular    Site: Right deltoid    Given By: Jetty Duhamel, CMA    Documented By: Jetty Duhamel, CMA    Manufacturer: [employer]    LotOretha Ellis    NDC: [id number]    Expiration Date: 12/16/2017

## 2017-05-14 NOTE — Telephone Encounter (Signed)
-----   Message from Carmell Austria, DO sent at 05/12/2017  1:58 PM PST -----  Better. Repeat with annual labs and continue with diet and Vit D supplement efforts.

## 2017-05-14 NOTE — Telephone Encounter (Signed)
Patient aware of the results and in recall

## 2017-05-24 MED ORDER — lovastatin (MEVACOR) 40 MG tablet
40 | ORAL_TABLET | Freq: Every evening | ORAL | 3 refills | 90.00000 days | Status: DC
Start: 2017-05-24 — End: 2017-09-07

## 2017-05-24 NOTE — Telephone Encounter (Signed)
Medication Being Requested: lovastatin    Send To Pharmacy, or Office Pick Up? Send to pharmacy    [facility] - [place], [place] - - [place], [place] - [address]  [address] [place] [address]  Phone: [phone] Fax: [phone]      How many Days Remaining of medication: 0    Additional Information:   Please try to send today.    **Informed patient that it may take up to 48-72 hours to fill prescription.**    Future Appointments:  No future appointments.

## 2017-07-10 ENCOUNTER — Telehealth: Payer: Self-pay | Admitting: *Deleted

## 2017-07-10 DIAGNOSIS — Z122 Encounter for screening for malignant neoplasm of respiratory organs: Secondary | ICD-10-CM

## 2017-07-10 DIAGNOSIS — Z87891 Personal history of nicotine dependence: Secondary | ICD-10-CM

## 2017-07-10 NOTE — Telephone Encounter (Signed)
Received referral for initial lung cancer screening scan. Contacted patient and obtained smoking history,(current, 46 pack year) as well as answering questions related to screening process. Patient denies signs of lung cancer such as weight loss or hemoptysis. Patient denies comorbidity that would prevent curative treatment if lung cancer were found. Patient is scheduled for shared decision making visit and CT scan on 07/26/17.

## 2017-07-19 MED ORDER — phenelzine (NARDIL) 15 mg tablet
15 | ORAL_TABLET | ORAL | 1 refills | Status: DC
Start: 2017-07-19 — End: 2017-09-07

## 2017-07-19 NOTE — Telephone Encounter (Signed)
Patient called in requesting to know if due for med check or if can get additional refills of Phenelzine 15mg  tab    Please call patient back in regards  Patient stated that she still has 1/2 bottle remaining

## 2017-07-19 NOTE — Telephone Encounter (Signed)
I spoke with [name]. She is doing well on current dose. She will need a refill soon and [facility] has to order this. I let her kni I will send script now. And she needs to be seen at least once a year to receive refills. She was seen in August.

## 2017-07-26 ENCOUNTER — Inpatient Hospital Stay: Payer: Self-pay | Attending: Nurse Practitioner | Admitting: Nurse Practitioner

## 2017-07-26 ENCOUNTER — Ambulatory Visit
Admission: RE | Admit: 2017-07-26 | Discharge: 2017-07-26 | Disposition: A | Discharge status: Home / Self Care | Payer: Self-pay | Source: Ambulatory Visit | Attending: Nurse Practitioner | Admitting: Nurse Practitioner

## 2017-07-26 ENCOUNTER — Encounter: Payer: Self-pay | Admitting: Nurse Practitioner

## 2017-07-26 DIAGNOSIS — I700 Atherosclerosis of aorta: Secondary | ICD-10-CM | POA: Insufficient documentation

## 2017-07-26 DIAGNOSIS — Z122 Encounter for screening for malignant neoplasm of respiratory organs: Secondary | ICD-10-CM | POA: Insufficient documentation

## 2017-07-26 DIAGNOSIS — Z87891 Personal history of nicotine dependence: Secondary | ICD-10-CM | POA: Insufficient documentation

## 2017-07-26 DIAGNOSIS — J439 Emphysema, unspecified: Secondary | ICD-10-CM | POA: Insufficient documentation

## 2017-07-26 DIAGNOSIS — F17210 Nicotine dependence, cigarettes, uncomplicated: Secondary | ICD-10-CM

## 2017-07-26 NOTE — Progress Notes (Signed)
In accordance with CMS guidelines, patient has met eligibility criteria including age, absence of signs or symptoms of lung cancer.  Social History   Tobacco Use  . Smoking status: Current Every Day Smoker    Packs/day: 1.00    Years: 46.00    Pack years: 46.00    Types: Cigarettes  . Smokeless tobacco: Never Used  Substance Use Topics  . Alcohol use: Yes    Comment: rare  . Drug use: No     A shared decision-making session was conducted prior to the performance of CT scan. This includes one or more decision aids, includes benefits and harms of screening, follow-up diagnostic testing, over-diagnosis, false positive rate, and total radiation exposure.  Counseling on the importance of adherence to annual lung cancer LDCT screening, impact of co-morbidities, and ability or willingness to undergo diagnosis and treatment is imperative for compliance of the program.  Counseling on the importance of continued smoking cessation for former smokers; the importance of smoking cessation for current smokers, and information about tobacco cessation interventions have been given to patient including [place] and [facility] quit [place] programs.  Written order for lung cancer screening with LDCT has been given to the patient and any and all questions have been answered to the best of my abilities.   Yearly follow up will be coordinated by Burgess Estelle, Thoracic Navigator.  Beckey Rutter, DNP, AGNP-C [place] at [facility] [phone] (office) 07/26/17 3:14 PM

## 2017-07-27 ENCOUNTER — Telehealth: Payer: Self-pay | Admitting: *Deleted

## 2017-07-27 NOTE — Telephone Encounter (Signed)
Notified patient of LDCT lung cancer screening program results with recommendation for 12 month follow up imaging. Also notified of incidental findings noted below, (especially addendum reported with thyroid nodule with recommendation for consideration of [place] evaluation), and is encouraged to discuss further with PCP who will receive a copy of this note and/or the CT report. Patient verbalizes understanding.   IMPRESSION: Lung-RADS 2, benign appearance or behavior. Continue annual screening with low-dose chest CT without contrast in 12 months.  Aortic Atherosclerosis (ICD10-I70.0) and Emphysema (ICD10-J43.9).

## 2017-09-06 ENCOUNTER — Encounter: Payer: PRIVATE HEALTH INSURANCE | Attending: DO | Primary: DO

## 2017-09-07 ENCOUNTER — Ambulatory Visit: Admit: 2017-09-07 | Payer: MEDICARE | Attending: DO | Primary: DO

## 2017-09-07 DIAGNOSIS — G2581 Restless legs syndrome: Secondary | ICD-10-CM

## 2017-09-07 MED ORDER — phenelzine (NARDIL) 15 mg tablet
15 | ORAL_TABLET | ORAL | 2 refills | Status: DC
Start: 2017-09-07 — End: 2018-04-02

## 2017-09-07 MED ORDER — rOPINIRole (REQUIP) 0.25 MG tablet
0.25 | ORAL_TABLET | ORAL | 3 refills | 30.00000 days | Status: DC
Start: 2017-09-07 — End: 2018-04-02

## 2017-09-07 MED ORDER — lovastatin (MEVACOR) 40 MG tablet
40 | ORAL_TABLET | Freq: Every evening | ORAL | 3 refills | Status: DC
Start: 2017-09-07 — End: 2018-09-19

## 2017-09-07 NOTE — Progress Notes (Signed)
[name] is a 67 y.o. female seen today for Hyperlipidemia and Review Medication  .    SUBJECTIVE:  HPI    [name] presents today to follow up hyperlipidemia and review medication. She is concerned of BL leg cramping in the middle of the night. She also states that BL hand numbness and intermittently hot to the touch and she is unsure why. She states that she has a corn on her little toe on her L foot. She has tried the patches and band aids and sometimes she is unabl to walk due to the pain. Awakens her with cramping. Many stressors caring for demented mother and father. Legal guardian of them.     HIV and HCV screening ordered.     The following portions of the patient's history were reviewed and updated as appropriate: allergies, current medications, past family history, past medical history, past surgical history and problem list.    Current Outpatient Medications on File Prior to Visit   Medication Sig Dispense Refill   . cholecalciferol, vitamin D3, 5,000 unit capsule Take 1 capsule by mouth daily. 1 capsule 0   . [DISCONTINUED] lovastatin (MEVACOR) 40 MG tablet Take 1 tablet by mouth every night at bedtime. 90 tablet 3   . [DISCONTINUED] phenelzine (NARDIL) 15 mg tablet Take 2 tabs in the morning and 3 tabs at night 450 tablet 1     No current facility-administered medications on file prior to visit.        Allergies   Allergen Reactions   . Nitrous Oxide Nausea And Vomiting   . Codeine Nausea And Vomiting       Past Medical History:   Diagnosis Date   . Bronchitis    . Depression     years ago   . GERD (gastroesophageal reflux disease)     intermittent-no Rx   . Heart murmur     ?   [name]        Past Surgical History:   Procedure Laterality Date   . DILATION AND CURETTAGE OF UTERUS     . PONV after dental     . PROCEDURE Left 11/03/2011    Procedure: OPEN REDUCTION INTERNAL FIXATION, BIMALLEOLAR  ANKLE;  Surgeon: [name], MD;  Location: [facility] OR;  Service: Orthopedics;  Laterality: Left;     . tonsils         Family History   Problem Relation Age of Onset   . Alzheimer's disease Mother    . Arthritis Father    . Lupus Sister    . Diabetes Brother    . No Known Health Problems Sister        Social History     Socioeconomic History   . Marital status: Married     Spouse name: [name]   . Number of children: 4   . Years of education: Not on file   . Highest education level: Not on file   Tobacco Use   . Smoking status: Current Every Day Smoker     Packs/day: 0.50     Years: 25.00     Pack years: 12.50   . Smokeless tobacco: Never Used   Substance and Sexual Activity   . Alcohol use: No   . Drug use: No   . Sexual activity: Yes     Partners: Male   Other Topics Concern   . Caffeine Concern Yes     Comment: occasional Pepsi   . Exercise No  REVIEW OF SYSTEMS:  Review of Systems   Constitutional: Negative for chills, fatigue and fever.   Musculoskeletal: Positive for joint swelling and myalgias (BL leg cramp). Negative for back pain, gait problem, neck pain and neck stiffness.   Neurological: Positive for numbness (BL hand). Negative for dizziness, light-headedness and headaches.   Psychiatric/Behavioral: Positive for sleep disturbance.       OBJECTIVE:  BP 122/84 (BP Location: Left arm, Patient Position: Sitting)   Pulse 89   Temp 37.2 ?C (98.9 ?F) (Oral)   Resp 14   Ht 5' 3 (1.6 m)   Wt 191 lb (86.6 kg)   SpO2 91%   BMI 33.83 kg/m?   Body mass index is 33.83 kg/m?[name]    PHYSICAL EXAM:  Gen: no acute distress, nontoxic, alert, oriented, well nourished.  Neck: supple without nodes, JVD, bruits, or thyromegaly.  Resp: good air movement, no retractions, symmetrical breath sounds, without rales, rhonchi, or wheezing  CV: RRR, normal S1, S2; no murmur or gallop  Lymph:no cervical, supraclavicular, axillary, or inguinal adenopathy.  Abd: nontender, good BS, no R/G/R. No masses, hepatomegaly or splenomegaly.  Ext: no cyanosis, edema, or varicose veins. Good pulses throughout.  MSK: no back or neck  tenderness or spasm, normal gait, muscle strength normal w/o spasm or tenderness. No joint swelling, redness, reduced AROM,  or deformity.  Neuro: CN II-XII intact, A&O x 3, neg Tinel's and Phalen's, no sensory deficits, DTR's 2/4 upper and lower extremities, and no weakness.  Psych: normal affect w/o depression or anxiety. No suicidal or homicidal ideation or plan.   Skin: no lesions or rash with good turgor.      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 Ropinirole. Hold lovastatin, IFTI. Recommended discontinue smoking and/or chewing and reviewed in detail the D/C options and risks for not quitting tobacco products. Continue Nardil with warnings. Podiatry ref rec, but pt refuses. Greater than 50% of 25 minute office visit spent in counseling and treatment plan implementation, medication review and management, and risks and alternatives to care of conditions.    I had a thoughtful and careful discussion regarding the issues today. All questions were answered. I educated and reassured. I encouraged follow up as needed.       This note was transcribed using speech recognition software. Please contact us for clarification if any questions arise relating to the wording of this document.

## 2017-09-12 ENCOUNTER — Other Ambulatory Visit: Payer: Self-pay | Admitting: Family Medicine

## 2017-09-12 DIAGNOSIS — E041 Nontoxic single thyroid nodule: Secondary | ICD-10-CM

## 2017-09-12 DIAGNOSIS — Z1239 Encounter for other screening for malignant neoplasm of breast: Secondary | ICD-10-CM

## 2017-09-17 ENCOUNTER — Ambulatory Visit: Admission: RE | Admit: 2017-09-17 | Payer: Medicare Other | Source: Ambulatory Visit

## 2017-09-18 ENCOUNTER — Ambulatory Visit
Admission: RE | Admit: 2017-09-18 | Discharge: 2017-09-18 | Disposition: A | Discharge status: Home / Self Care | Payer: Medicare Other | Source: Ambulatory Visit | Attending: Family Medicine | Admitting: Family Medicine

## 2017-09-18 DIAGNOSIS — E041 Nontoxic single thyroid nodule: Secondary | ICD-10-CM | POA: Insufficient documentation

## 2017-09-27 ENCOUNTER — Other Ambulatory Visit: Payer: Self-pay

## 2017-10-15 ENCOUNTER — Ambulatory Visit
Admission: RE | Admit: 2017-10-15 | Discharge: 2017-10-15 | Disposition: A | Discharge status: Home / Self Care | Payer: Medicare Other | Source: Ambulatory Visit | Attending: Family Medicine | Admitting: Family Medicine

## 2017-10-15 DIAGNOSIS — Z13820 Encounter for screening for osteoporosis: Secondary | ICD-10-CM | POA: Diagnosis present

## 2017-10-15 DIAGNOSIS — Z1239 Encounter for other screening for malignant neoplasm of breast: Secondary | ICD-10-CM

## 2017-10-15 DIAGNOSIS — Z1231 Encounter for screening mammogram for malignant neoplasm of breast: Secondary | ICD-10-CM | POA: Diagnosis not present

## 2017-10-22 ENCOUNTER — Other Ambulatory Visit: Payer: Self-pay | Admitting: Otolaryngology

## 2017-10-22 DIAGNOSIS — E041 Nontoxic single thyroid nodule: Secondary | ICD-10-CM

## 2017-10-31 ENCOUNTER — Ambulatory Visit
Admission: RE | Admit: 2017-10-31 | Discharge: 2017-10-31 | Disposition: A | Discharge status: Home / Self Care | Payer: Medicare Other | Source: Ambulatory Visit | Attending: Otolaryngology | Admitting: Otolaryngology

## 2017-10-31 DIAGNOSIS — E041 Nontoxic single thyroid nodule: Secondary | ICD-10-CM | POA: Insufficient documentation

## 2017-10-31 NOTE — Discharge Instructions (Signed)
Thyroid Biopsy °The thyroid gland is a butterfly-shaped gland located in the front of the neck. It produces hormones that affect metabolism, growth and development, and body temperature. Thyroid biopsy is a procedure in which small samples of tissue or fluid are removed from the thyroid gland. The samples are then looked at under a microscope to check for abnormalities. This procedure is done to determine the cause of thyroid problems. It may be done to check for infection, cancer, or other thyroid problems. °Two methods may be used for a thyroid biopsy. In one method, a thin needle is inserted through the skin and into the thyroid gland. In the other method, an open incision is made through the skin. °Tell a health care provider about: °· Any allergies you have. °· All medicines you are taking, including vitamins, herbs, eye drops, creams, and over-the-counter medicines. °· Any problems you or family members have had with anesthetic medicines. °· Any blood disorders you have. °· Any surgeries you have had. °· Any medical conditions you have. °What are the risks? °Generally, this is a safe procedure. However, problems can occur and include: °· Bleeding from the procedure site. °· Infection. °· Injury to structures near the thyroid gland. ° °What happens before the procedure? °· Ask your health care provider about: °? Changing or stopping your regular medicines. This is especially important if you are taking diabetes medicines or blood thinners. °? Taking medicines such as aspirin and ibuprofen. These medicines can thin your blood. Do not take these medicines before your procedure if your health care provider asks you not to. °· Do not eat or drink anything after midnight on the night before the procedure or as directed by your health care provider. °· You may have a blood sample taken. °What happens during the procedure? °Either of these methods may be used to perform a thyroid biopsy: °· Fine needle biopsy. You may  be given medicine to help you relax (sedative). You will be asked to lie on your back with your head tipped backward to extend your neck. An area on your neck will be cleaned. A needle will then be inserted through the skin of your neck. You may be asked to avoid coughing, talking, swallowing, or making sounds during some portions of the procedure. The needle will be withdrawn once the tissue or fluid samples have been removed. Pressure may be applied to your neck to reduce swelling and ensure that bleeding has stopped. The samples will be sent to a lab for examination. °· Open biopsy. You will be given medicine to make you sleep (general anesthetic). An incision will be made in your neck. A sample of thyroid tissue will be removed using surgical tools. The tissue sample will be sent for examination. In some cases, the sample may be examined during the biopsy. If that is done and cancer cells are found, some or all of the thyroid gland may be removed. The incision will be closed with stitches. ° °What happens after the procedure? °· Your recovery will be assessed and monitored. °· You may have soreness and tenderness at the site of the biopsy. This should go away after a few days. °· If you had an open biopsy, you may have a hoarse voice or sore throat for a couple days. °· It is your responsibility to get your test results. °This information is not intended to replace advice given to you by your health care provider. Make sure you discuss any questions you have with   your health care provider. °Document Released: 04/02/2007 Document Revised: 02/06/2016 Document Reviewed: 08/28/2013 °Elsevier Interactive Patient Education © 2018 Elsevier Inc. ° °

## 2017-10-31 NOTE — Procedures (Signed)
US guided FNA of left thyroid nodule.  5 FNAs performed.  No immediate complication.  No bleeding.

## 2017-11-01 LAB — CYTOLOGY - NON PAP

## 2018-01-30 NOTE — Telephone Encounter (Signed)
I spoke with [name], she will complete labs this week. DXA and Mammo ordered as well.

## 2018-01-30 NOTE — Telephone Encounter (Signed)
-----   Message from [place], [place] sent at 01/26/2017  2:01 PM PDT -----  Regarding: labs  Annual labs due

## 2018-02-28 ENCOUNTER — Other Ambulatory Visit: Admit: 2018-02-28 | Discharge: 2018-02-28 | Payer: MEDICARE | Primary: DO

## 2018-02-28 DIAGNOSIS — E782 Mixed hyperlipidemia: Secondary | ICD-10-CM

## 2018-02-28 LAB — CBC WITH AUTO DIFFERENTIAL
Basophils %: 1 % (ref 0–2)
Basophils, Absolute: 0.1 10*3/ÂµL (ref 0.0–0.2)
Eosinophils %: 3 % (ref 0–7)
Eosinophils, Absolute: 0.2 10*3/ÂµL (ref 0.0–0.7)
HCT: 39.8 % (ref 37.0–48.0)
Hemoglobin: 13.4 g/dL (ref 12.0–16.0)
Lymphocytes %: 30 % (ref 25–45)
Lymphocytes, Absolute: 2.1 10*3/ÂµL (ref 1.1–4.3)
MCH: 28.5 pg (ref 27.0–34.0)
MCHC: 33.6 g/dL (ref 32.0–36.0)
MCV: 84.9 fL (ref 81.0–99.0)
MPV: 8.2 fL (ref 7.4–10.4)
Monocytes %: 6 % (ref 0–12)
Monocytes, Absolute: 0.4 10*3/ÂµL (ref 0.0–1.2)
Neutrophils %: 60 % (ref 35–70)
Neutrophils, Absolute: 4.3 10*3/ÂµL (ref 1.6–7.3)
Platelet Count: 186 10*3/ÂµL (ref 150–400)
RBC: 4.69 10*6/ÂµL (ref 4.20–5.40)
RDW: 14.5 % (ref 11.5–14.5)
WBC: 7.2 10*3/ÂµL (ref 4.8–10.8)

## 2018-02-28 LAB — COMPREHENSIVE METABOLIC PANEL
ALT - Alanine Aminotransferase: 12 IU/L (ref 7–52)
AST - Aspartate Aminotransferase: 17 IU/L (ref 8–39)
Albumin/Globulin Ratio: 1.6 (ref >0.9)
Albumin: 4.1 g/dL (ref 3.5–5.0)
Alkaline Phosphatase: 86 IU/L (ref 34–104)
Anion Gap: 7 mmol/L (ref 4–13)
BUN: 10 mg/dL (ref 6–23)
Bilirubin Total: 0.5 mg/dL (ref 0.3–1.2)
CO2 - Carbon Dioxide: 27 mmol/L (ref 21–31)
Calcium: 9.4 mg/dL (ref 8.6–10.3)
Chloride: 108 mmol/L (ref 98–111)
Creatinine: 0.68 mg/dL (ref 0.55–1.10)
GFR Estimated Female: >60 mL/min/{1.73_m2} (ref >=60)
Globulin: 2.6 g/dL (ref 2.2–3.7)
Glucose: 113 mg/dL — ABNORMAL HIGH (ref 80–99)
Potassium: 4 mmol/L (ref 3.5–5.1)
Protein Total: 6.7 g/dL (ref 6.0–8.0)
Sodium: 142 mmol/L (ref 135–143)

## 2018-02-28 LAB — CORONARY RISK LIPID PANEL
Cholesterol, HDL: 38 mg/dL — ABNORMAL LOW (ref >=40)
Cholesterol: 191 mg/dL (ref <200)
LDL Calculated: 122 mg/dL — ABNORMAL HIGH (ref <100)
Triglyceride: 155 mg/dL — ABNORMAL HIGH (ref 30–149)

## 2018-02-28 LAB — 25-OH HYDROXY VITAMIN D, CALCIFEROL, TOTAL OF D2 & D3: Vitamin D, 25-OH Total: 40.4 ng/mL (ref 30.0–100.0)

## 2018-02-28 LAB — HIV ANTIGEN & ANTIBODY SCREEN: HIV1/HIV2 Antibody/Antigen Screen: NONREACTIVE

## 2018-02-28 LAB — HEPATITIS C ANTIBODY W/ REFLEX HCV RNA PCR (ASA): Hepatitis C Antibody Interpretation: NEGATIVE

## 2018-02-28 LAB — TSH: TSH - Thyroid Stimulating Hormone: 1.67 u[IU]/mL (ref 0.45–5.33)

## 2018-02-28 LAB — FOLATE: Folate: >20 ng/mL (ref 5.9–24.8)

## 2018-03-04 NOTE — Telephone Encounter (Signed)
Took call from [employer] her the results of her labs and that we will redo Lipids in 6 months and Vit D / Folate in 1 year

## 2018-03-04 NOTE — Telephone Encounter (Signed)
-----   Message from Carmell Austria, DO sent at 03/03/2018  9:29 PM PDT -----  Chol up. Work on [employer] and repeat lipids in 6 mos and rest except folate and D in 1 yr.

## 2018-03-04 NOTE — Telephone Encounter (Signed)
LMTCB, need to discuss lab results with patient

## 2018-03-21 ENCOUNTER — Institutional Professional Consult (permissible substitution): Admit: 2018-03-21 | Payer: MEDICARE | Primary: DO

## 2018-03-21 DIAGNOSIS — Z23 Encounter for immunization: Secondary | ICD-10-CM

## 2018-03-21 NOTE — Progress Notes (Signed)
[name] presents today for his flu and pneumococcal 13 vaccine.  Also scheduled appt for 04/02/18    Immunizations Administered     Name Date Dose VIS Date Route    Flu High Dose (PF) 03/21/2018 0.5 mL 01/23/2014 Intramuscular    Site: Left deltoid    Given By: Manfred Arch, CMA    Documented By: Manfred Arch, CMA    Manufacturer: [employer]ot[employer] Brand    NDC: [id number]    Expiration Date: 10/01/2018    Pneumococcal Conjugate 13-Valent 03/21/2018 0.5 mL 04/23/2014 Intramuscular    Site: Right deltoid    Given By: Manfred Arch, CMA    Documented By: Manfred Arch, CMA    Manufacturer: [employer]    Lot: [id number]    NDC: [id number]    Expiration Date: 12/20/2019

## 2018-04-02 ENCOUNTER — Ambulatory Visit: Admit: 2018-04-02 | Discharge: 2018-04-08 | Payer: MEDICARE | Attending: DO | Primary: DO

## 2018-04-02 DIAGNOSIS — F3341 Major depressive disorder, recurrent, in partial remission: Secondary | ICD-10-CM

## 2018-04-02 MED ORDER — phenelzine (NARDIL) 15 mg tablet
15 | ORAL_TABLET | ORAL | 3 refills | Status: DC
Start: 2018-04-02 — End: 2018-12-13

## 2018-04-02 NOTE — Progress Notes (Signed)
[name] is a 67 y.o. female seen today for Depression and Lab Results  .    SUBJECTIVE:  HPI    [name] presents today to follow up depression and review recent lab results.      Recent Results (from the past 2016 hour(s))   Thyroid Stimulating Hormone -Routine    Collection Time: 02/28/18  1:57 PM   Result Value Ref Range    TSH - Thyroid Stimulating Hormone 1.67 0.45 - 5.33 ?IU/mL   Coronary Risk Lipid Panel -Routine    Collection Time: 02/28/18  1:57 PM   Result Value Ref Range    Triglyceride 155 (H) 30 - 149 mg/dL    Cholesterol 161 <096 mg/dL    Cholesterol, HDL 38 (L) >=40 mg/dL    LDL Calculated 045 (H) <100 mg/dL   Comprehensive Metabolic Panel -Routine    Collection Time: 02/28/18  1:57 PM   Result Value Ref Range    Sodium 142 135 - 143 mmol/L    Potassium 4.0 3.5 - 5.1 mmol/L    Chloride 108 98 - 111 mmol/L    CO2 - Carbon Dioxide 27 21 - 31 mmol/L    Glucose 113 (H) 80 - 99 mg/dL    BUN 10 6 - 23 mg/dL    Creatinine 4.09 8.11 - 1.10 mg/dL    Calcium 9.4 8.6 - 91.4 mg/dL    AST - Aspartate Aminotransferase 17 8 - 39 IU/L    ALT - Alanine Aminotransferase 12 7 - 52 IU/L    Alkaline Phosphatase 86 34 - 104 IU/L    Bilirubin Total 0.5 0.3 - 1.2 mg/dL    Protein Total 6.7 6.0 - 8.0 g/dL    Albumin 4.1 3.5 - 5.0 g/dL    Globulin 2.6 2.2 - 3.7 g/dL    Albumin/Globulin Ratio 1.6 >0.9    Anion Gap 7 4 - 13 mmol/L    GFR Estimated Female >60 >=60 mL/min/1.13m*2    GFR Additional Info     CBC with Auto Differential -Routine    Collection Time: 02/28/18  1:57 PM   Result Value Ref Range    WBC 7.2 4.8 - 10.8 10*3/?L    RBC 4.69 4.20 - 5.40 10*6/?L    Hemoglobin 13.4 12.0 - 16.0 g/dL    HCT 78.2 95.6 - 21.3 %    MCV 84.9 81.0 - 99.0 fL    MCH 28.5 27.0 - 34.0 pg    MCHC 33.6 32.0 - 36.0 g/dL    RDW 08.6 57.8 - 46.9 %    Platelet Count 186 150 - 400 10*3/?L    MPV 8.2 7.4 - 10.4 fL    Neutrophils % 60 35 - 70 %    Lymphocytes % 30 25 - 45 %    Monocytes % 6 0 - 12 %    Eosinophils % 3 0 - 7 %    Basophils % 1 0 -  2 %    Neutrophils, Absolute 4.3 1.6 - 7.3 10*3/?L    Lymphocytes, Absolute 2.1 1.1 - 4.3 10*3/?L    Monocytes, Absolute 0.4 0.0 - 1.2 10*3/?L    Eosinophils, Absolute 0.2 0.0 - 0.7 10*3/?L    Basophils, Absolute 0.1 0.0 - 0.2 10*3/?L    Differential Type Automated Differential    25-OH Vitamin D Total D2+D3 -Routine    Collection Time: 02/28/18  1:57 PM   Result Value Ref Range    Vitamin D 25-OH Total 40.4 30.0 - 100.0  ng/mL   Folate -Routine    Collection Time: 02/28/18  1:57 PM   Result Value Ref Range    Folate >20.0 5.9 - 24.8 ng/mL   Hepatitis C Antibody (Total) -Routine    Collection Time: 02/28/18  1:57 PM   Result Value Ref Range    Hepatitis C Antibody Interpretation Negative Negative   HIV 1/2 Antibody & Antigen -Routine    Collection Time: 02/28/18  1:57 PM   Result Value Ref Range    HIV1/HIV2 Antibody/Antigen Screen Non Reactive Non Reactive         The following portions of the patient's history were reviewed and updated as appropriate: allergies, current medications, past family history, past medical history, past surgical history and problem list.    Current Outpatient Medications on File Prior to Visit   Medication Sig Dispense Refill   . cholecalciferol, vitamin D3, 5,000 unit capsule Take 1 capsule by mouth daily. 1 capsule 0   . lovastatin (MEVACOR) 40 MG tablet Take 1 tablet by mouth every night at bedtime. 90 tablet 3     No current facility-administered medications on file prior to visit.        Allergies   Allergen Reactions   . Nitrous Oxide Nausea And Vomiting   . Codeine Nausea And Vomiting       Past Medical History:   Diagnosis Date   . Bronchitis    . Depression     years ago   . GERD (gastroesophageal reflux disease)     intermittent-no Rx   . Heart murmur     ?   [name]        Past Surgical History:   Procedure Laterality Date   . DILATION AND CURETTAGE OF UTERUS     . PONV after dental     . PROCEDURE Left 11/03/2011    Procedure: OPEN REDUCTION INTERNAL FIXATION, BIMALLEOLAR   ANKLE;  Surgeon: [name], MD;  Location: [facility] OR;  Service: Orthopedics;  Laterality: Left;   . tonsils         Family History   Problem Relation Age of Onset   . Alzheimer's disease Mother    . Arthritis Father    . Lupus Sister    . Diabetes Brother    . No Known Health Problems Sister        Social History     Socioeconomic History   . Marital status: Married     Spouse name: [name]   . Number of children: 4   . Years of education: Not on file   . Highest education level: Not on file   Tobacco Use   . Smoking status: Current Every Day Smoker     Packs/day: 0.50     Years: 25.00     Pack years: 12.50   . Smokeless tobacco: Never Used   Substance and Sexual Activity   . Alcohol use: No   . Drug use: No   . Sexual activity: Yes     Partners: Male   Other Topics Concern   . Caffeine Concern Yes     Comment: occasional Pepsi   . Exercise No       REVIEW OF SYSTEMS:  Review of Systems   Constitutional: Positive for fatigue. Negative for activity change, appetite change, chills, diaphoresis, fever and unexpected weight change.   Eyes: Negative.    Respiratory: Positive for cough. Negative for apnea, choking, chest tightness, shortness of breath, wheezing and stridor.  Cardiovascular: Negative for chest pain, palpitations and leg swelling.   Gastrointestinal: Negative for abdominal distention, abdominal pain, anal bleeding, blood in stool, constipation, diarrhea, nausea, rectal pain and vomiting.   Genitourinary: Negative.    Breast Symptoms: Negative.    Musculoskeletal: Positive for arthralgias, back pain and myalgias. Negative for gait problem, joint swelling, neck pain and neck stiffness.   Skin: Negative for color change, pallor, rash and wound.   Neurological: Negative for dizziness, light-headedness and headaches.   Psychiatric/Behavioral: Positive for sleep disturbance.   Endocrine: Negative.        OBJECTIVE:  BP 122/86 (BP Location: Left arm, Patient Position: Sitting)   Pulse 100   Temp 37 ?C  (98.6 ?F) (Oral)   Resp 16   Ht 5' 3 (1.6 m)   Wt 193 lb (87.5 kg)   SpO2 94%   BMI 34.19 kg/m?   Body mass index is 34.19 kg/m?[name]    PHYSICAL EXAM:  Gen: no acute distress, nontoxic, alert, oriented, well nourished.  Heent: head normocephalic w/o trauma,  PERRLA, EOMI, conjunctiva and sclera intact w/o d/c. EAC's patent w/o cerumen impaction. TM's intact.  No nasal d/c and turbinate hypertrophy. No facial tenderness over sinuses. No oral lesions. Teeth with poor hygiene w/o abscess. Pharynx non erythematous w/o mass, tonsillar enlargement or d/c.  Neck: supple without nodes, JVD, bruits, or thyromegaly.  Resp: good air movement, no retractions, symmetrical breath sounds, without rales, rhonchi, or wheezing  CV: RRR, normal S1, S2; no murmur or gallop  Abd: nontender, good BS, no R/G/R. No masses, hepatomegaly or splenomegaly.  Ext: no cyanosis, edema, or varicose veins. Good pulses throughout.  MSK: no back or neck tenderness or spasm, normal gait, muscle strength normal w/o spasm or tenderness. No joint swelling, tenderness, reduced AROM,  or deformity.  Neuro: CN II-XII intact, A&O x 3, no sensory deficits, DTR's 2/4 upper and lower extremities, and no weakness.  Psych: normal affect w/o depression or anxiety. No suicidal or homicidal ideation or plan.   Skin: no lesions or rash with good turgor.      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 Recommended discontinue smoking and/or chewing and reviewed in detail the D/C options and risks for not quitting tobacco products.Plant-based diet, exercise, and weight loss recommended. Warnings regarding increased morbidity and mortality with being overweight. Greater than 50% of 25 minute office visit spent in counseling and treatment  plan implementation, medication review and management, and risks and alternatives to care of conditions.    I had a thoughtful and careful discussion regarding the issues today. All questions were answered. I educated and reassured. I encouraged follow up as needed.       This note was transcribed using speech recognition software. Please contact us for clarification if any questions arise relating to the wording of this document.

## 2018-04-25 ENCOUNTER — Inpatient Hospital Stay: Payer: MEDICARE | Attending: DO | Primary: DO

## 2018-06-05 ENCOUNTER — Encounter: Payer: MEDICARE | Primary: DO

## 2018-06-05 ENCOUNTER — Inpatient Hospital Stay: Payer: MEDICARE | Attending: DO | Primary: DO

## 2018-06-14 ENCOUNTER — Inpatient Hospital Stay: Admit: 2018-06-14 | Payer: MEDICARE | Attending: DO | Primary: DO

## 2018-06-14 DIAGNOSIS — Z1231 Encounter for screening mammogram for malignant neoplasm of breast: Secondary | ICD-10-CM

## 2018-06-17 NOTE — Telephone Encounter (Signed)
LMTCB, need to discuss Mammogram results.    (placed in recall).

## 2018-06-17 NOTE — Telephone Encounter (Signed)
[name] returned phone call. She is aware of results and expressed understanding.

## 2018-06-17 NOTE — Telephone Encounter (Signed)
-----   Message from Carmell Austria, DO sent at 06/17/2018  9:10 AM PST -----  WNL. REPEAT IN 1 yr.

## 2018-07-03 ENCOUNTER — Ambulatory Visit: Admit: 2018-07-03 | Discharge: 2018-07-09 | Payer: MEDICARE | Attending: DO | Primary: DO

## 2018-07-03 DIAGNOSIS — Z0000 Encounter for general adult medical examination without abnormal findings: Secondary | ICD-10-CM

## 2018-07-03 NOTE — Progress Notes (Signed)
Subjective:    [name] is a 68 y.o. female who presents for a subsequent Medicare Annual Wellness visit    List of Healthcare Providers and/or Medical suppliers involved in patient's care:    Patient Care Team:  Carmell Austria, [name] as PCP - General (Family Medicine)    MEDICARE Joan Flores 07/03/2018   During the past 4 weeks, have you been bothered by emotional problems such as feeling anxious, depressed, irritable, sad or downhearted and blue? Not at all   During the past 4 weeks, has your physical and emotional health limited your social activities with family, friends, and/or neighbors? Slightly   During the past 4 weeks, how much bodily pain have you generally had? None   During the past 4 weeks, was someone available to help you if you needed or wanted help? Yes   During the past 4 weeks, what level of physical activity could you perform for at least 2 minutes? Light   If you needed to travel by car, bus, etc. could you travel alone? Yes   Can you go shopping for groceries or clothes without someone's help? Yes   Can you prepare your own meals? Yes   Can you [name] housework without help? Yes   Because of any health problems, [name] you need the help of another person with your personal care needs such as eating, bathing, dressing or getting around the house? No   Can you manage your own money without help? Yes   During the past 4 weeks, how would you rate your health in general? Good   In general, during the past 4 weeks, how have things been going for you? Pretty well   Are you having difficulties driving a vehicle? No   [name] you wear a seatbelt when riding in a car? Yes   Have you fallen 2 or more times in the past year? No   Are you afraid of falling? No   Are you a smoker? No, but I am a former smoker   During the past 4 weeks how many drinks of wine, beer or other alcoholic beverages have you had? None   [name] you exercise for approximately 20 minutes three or more days a week? No   How often [name] you have trouble  taking medicines the way you have been told to take them? Seldom   How confident are you that you can control and manage most of your health problems? Very confident   Have you been given any information to help you with hazards in your house that might hurt you? Yes   Was handout given to the patient? Yes   Have you been given any information to help you keep track of your medication? Yes   Falling or feeling dizzy when standing up Never   Sexual problems Never   Trouble eating Never   Teeth or denture problems Never   Problems using the telephone or communicating Never   Tiredness or fatigue Never   Preparing food and eating No   Bathing yourself No   Getting dressed No   Using the toilet No   Moving around from place to place No   Was a Fall Risk Screening performed? Yes       Review of Histories:  The following portions of the patient's history were reviewed and updated as appropriate: allergies, current medications, past family history, past medical history, past social history, past surgical history and problem list.    Review  of Systems   Eyes: Negative for blurred vision.   Respiratory: Negative for shortness of breath.    Cardiovascular: Negative for chest pain.   Neurological: Negative for headaches.       PHQ-9 Total: 0           Personalized Written Plan:    Hearing and Vision:  Hearing difficulties (Whisper Test):  Pass. -   and Vision by Snellen Chart: right eye:  20/40 left eye:  20/50 -   [name] doesnt wear hearing aids.    Vitals:  Blood pressure 130/68, pulse 80, height 5' 3 (1.6 m), weight 198 lb 3.2 oz (89.9 kg), SpO2 97 %, not currently breastfeeding.  Body mass index is 35.11 kg/m?[name]    Vaccines:  Prevnar: 03/21/2018 Vaccine is completed.  Pneumovax: Vaccine recommended one year after receiving the Prevnar. Due: 03/22/2019.  Shingles: New Shingles vaccines (Shingrix) recommended for all adults 66 years of age and older. [name] advised to inquire at local pharmacy in regards to  receiving/cost/coverage of vaccine.  TDaP: Vaccine is recommended every 10 years.  Influenza: 03/21/2018 Vaccine is recommended every year.     Home Safety & Falls:  Safe in current environment: Yes   [name] feels safe in her current home, she lives at home with husband    Fall Risk Assessment performed: Not at risk for falls.   [name] reports the following in the home:  1. negative for grab bars in shower.   2. positive for secure throw rugs throughout the household.   3. negative for stairs.     [name] was given education related to home safety updates.    Depression Screen:  PHQ-9 Total: 0 (Depression Screening Score)  Patient Depression Risk: [name] is not currently at risk.    Mental Status:  No concern for cognitive impairment at this time.     Mini-Cog 07/03/2018   Step 1: Three Word Registration 3   Step 2: Clock Drawing 2 Normal   Step 3: Immediate Word Recall 3 Words Recalled   Mini Cog Score 5        Based on previous and/or current Medicare Assessments:  Are there any mental health conditions or any such risk factors that have been identified? No    End of Life Planning:  Physician Orders for Life Sustaining Treatment (POLST): Not on file.     Advanced Directive: [name] was sent home with packet.   Reviewed significance and utility of Advanced Directive with [name].    Cancer Screen:  Colorectal: [name] is due for screening, referral has been ordered.    The USPSTF recommends three screening options for adults 50-75:  -Colonoscopy every 10 years   -FOBT with a sensitive test  -Flexible sigmoidoscopy every 5 years with sensitive FOBT every 3 years.  ACS-MSTF guideline recommends:  -Offer screening beginning at age 85 years for average-risk patients.   -Discontinue screening when the individuals estimated life expectancy is less than 10 years.    Lung Cancer Screen: Current Smoker. Smoked 0.5 ppd x 25 years. Does not meet criteria   The U.S. Preventive Services Task Force (USPSTF) recommends that physicians screen  high-risk patients, 24- to 55 year olds for lung cancer annually using low-dose CT scans. High-risk adults in this age group include those who have accumulated 30 cigarette pack-years and are still smoking or who quit within the past 15 years.    Breast Cancer Screen: 06/14/2018 Benign. Recommended repeat exam 1 year, 05/2019.  There are two current sets  of guidelines, ACOG recommends annual mammograms starting at age 24. USPSTF recommends  Women start  Biennial screening ages 12-74.     Cervical/Ovarian Cancer Screen: 01/24/2016 Benign.   - [name] to discuss with Katherine Rindfleisch B Neeko Pharo, [name] at next visit.   USPSTF recommends against cervical cancer screening in women 13 and older who have had adequate screening. There is no good screening test for ovarian cancer. Symptoms of ovarian cancer include: persistent lower abdominal pain/bloating and increased girth.    Abdominal Aortic Aneurysm Screen:   AAA Ultrasound Screening: Smoking history - Patient declined at this time. Will discuss further with Dr. Rayvon Char.   The Society for Vascular Surgery recommends:  -Screening for women 61 and older who have smoked or have a family history.     Osteoporosis Screen:  DXA Scan:     -Order placed by PCP on 01/30/2018. Scheduled 07/31/2018.  USPSTF states 1 in 2 post menopausal women and 1 in 5 older men are at risk for an osteoporosis related fracture. Osteoporosis is common in all racial groups but most common in white persons. Rates increase with age.    Lipids and Diabetes screening:  Lipid Panel: 02/28/2018 Total 191 HDL 38 LDL 122.   Diabetic Labs: 02/28/2018 Fasting CMP Glucose 113. Mearl declined at this time. Would like to have CBG recheck with Lipid panel in 3 months. IS encouraged to work on low fat/cholesterol and limiting sugar intake.     The U.S. Task Force on [employer] recommends a lipid panel and diabetic screening every 3 years or more frequently for high risk patients.    Follow up/Additional:  Redith denies  any additional questions or concerns at this visit. [place] is encouraged to call with any further questions or concerns.    Diet and Exercise:  General adult health, diet and exercise recommendation of 3-4 cups of fresh fruits and vegetables and one hour of exercise daily.    Orders Placed During Visit:  No orders of the defined types were placed in this encounter.         Annual Wellness Visit portion of this visit completed by [facility], LPN.  Additional evaluation with Grayson Pfefferle B Ashyr Hedgepath, [name] to be conducted following this portion of the visit.    Was the patient educated and counseled about appropriate screening and preventive services? Yes    Patient instructions (the written plan) will be printed and given to the patient: Yes     Pt seen and above information reviewed.

## 2018-07-03 NOTE — Patient Instructions (Signed)
Personalized Written Plan:    Hearing and Vision:  Hearing difficulties (Whisper Test):  Pass. -   and Vision by Snellen Chart: right eye:  20/40 left eye:  20/50 -   [name] doesnt wear hearing aids.    Vitals:  Blood pressure 130/68, pulse 80, height 5' 3 (1.6 m), weight 198 lb 3.2 oz (89.9 kg), SpO2 97 %, not currently breastfeeding.  Body mass index is 35.11 kg/m?[name]    Vaccines:  Prevnar: 03/21/2018 Vaccine is completed.  Pneumovax: Vaccine recommended one year after receiving the Prevnar. Due: 03/22/2019.  Shingles: New Shingles vaccines (Shingrix) recommended for all adults 43 years of age and older. Sydnei advised to inquire at local pharmacy in regards to receiving/cost/coverage of vaccine.  TDaP: Vaccine is recommended every 10 years.  Influenza: 03/21/2018 Vaccine is recommended every year.     Home Safety & Falls:  Safe in current environment: Yes   [name] feels safe in her current home, she lives at home with husband    Fall Risk Assessment performed: Not at risk for falls.   [name] reports the following in the home:  1. negative for grab bars in shower.   2. positive for secure throw rugs throughout the household.   3. negative for stairs.     [name] was given education related to home safety updates.    Depression Screen:  PHQ-9 Total: 0 (Depression Screening Score)  Patient Depression Risk: [name] is not currently at risk.    Mental Status:  No concern for cognitive impairment at this time.     Mini-Cog 07/03/2018   Step 1: Three Word Registration 3   Step 2: Clock Drawing 2 Normal   Step 3: Immediate Word Recall 3 Words Recalled   Mini Cog Score 5        Based on previous and/or current Medicare Assessments:  Are there any mental health conditions or any such risk factors that have been identified? No    End of Life Planning:  Physician Orders for Life Sustaining Treatment (POLST): Not on file.     Advanced Directive: [name] was sent home with packet.   Reviewed significance and utility of Advanced Directive  with [name].    Cancer Screen:  Colorectal: [name] is due for screening, referral has been ordered.    The USPSTF recommends three screening options for adults 50-75:  -Colonoscopy every 10 years   -FOBT with a sensitive test  -Flexible sigmoidoscopy every 5 years with sensitive FOBT every 3 years.  ACS-MSTF guideline recommends:  -Offer screening beginning at age 9 years for average-risk patients.   -Discontinue screening when the individuals estimated life expectancy is less than 10 years.    Lung Cancer Screen: Current Smoker. Smoked 0.5 ppd x 25 years. Does not meet criteria   The U.S. Preventive Services Task Force (USPSTF) recommends that physicians screen high-risk patients, 22- to 19 year olds for lung cancer annually using low-dose CT scans. High-risk adults in this age group include those who have accumulated 30 cigarette pack-years and are still smoking or who quit within the past 15 years.    Breast Cancer Screen: 06/14/2018 Benign. Recommended repeat exam 1 year, 05/2019.  There are two current sets of guidelines, ACOG recommends annual mammograms starting at age 13. USPSTF recommends  Women start  Biennial screening ages 15-74.     Cervical/Ovarian Cancer Screen: 01/24/2016 Benign.   - [name] to discuss with DOUGLAS B MCMAHON, DO at next visit.   USPSTF recommends against cervical cancer screening  in women 40 and older who have had adequate screening. There is no good screening test for ovarian cancer. Symptoms of ovarian cancer include: persistent lower abdominal pain/bloating and increased girth.    Abdominal Aortic Aneurysm Screen:   AAA Ultrasound Screening: Smoking history - Patient declined at this time. Will discuss further with Dr. Rayvon Char.   The Society for Vascular Surgery recommends:  -Screening for women 26 and older who have smoked or have a family history.     Osteoporosis Screen:  DXA Scan:     -Order placed by PCP on 01/30/2018. Scheduled 07/31/2018.  USPSTF states 1 in 2 post menopausal  women and 1 in 5 older men are at risk for an osteoporosis related fracture. Osteoporosis is common in all racial groups but most common in white persons. Rates increase with age.    Lipids and Diabetes screening:  Lipid Panel: 02/28/2018 Total 191 HDL 38 LDL 122.   Diabetic Labs: 02/28/2018 Fasting CMP Glucose 113. Korina declined at this time. Would like to have CBG recheck with Lipid panel in 3 months. IS encouraged to work on low fat/cholesterol and limiting sugar intake.     The U.S. Task Force on [employer] recommends a lipid panel and diabetic screening every 3 years or more frequently for high risk patients.    Follow up/Additional:  Sema denies any additional questions or concerns at this visit. Secily is encouraged to call with any further questions or concerns.    Diet and Exercise:  General adult health, diet and exercise recommendation of 3-4 cups of fresh fruits and vegetables and one hour of exercise daily.    Orders Placed During Visit:  No orders of the defined types were placed in this encounter.

## 2018-07-13 ENCOUNTER — Telehealth: Payer: Self-pay | Admitting: *Deleted

## 2018-07-13 ENCOUNTER — Telehealth: Payer: Self-pay

## 2018-07-13 ENCOUNTER — Encounter: Payer: Self-pay | Admitting: *Deleted

## 2018-07-13 DIAGNOSIS — Z122 Encounter for screening for malignant neoplasm of respiratory organs: Secondary | ICD-10-CM

## 2018-07-13 NOTE — Telephone Encounter (Signed)
Patient has been notified that the annual lung cancer screening low dose CT scan is due currently or will be in the near future.  Confirmed that the patient is within the age range of 71-80, and asymptomatic, and currently exhibits no signs or symptoms of lung cancer.  Patient denies illness that would prevent curative treatment for lung cancer if found.  Verified smoking history, current smoker [id number] history .  The shared decision making visit was completed on 07-26-17.  Patient is agreeable for the CT scan to be scheduled.  Will call patient back with date and time of appointment.

## 2018-07-13 NOTE — Telephone Encounter (Signed)
Cal pt regarding lung screening. Pt is a current smoker, smoking 1/2 pack per day. Pt would like scan to be any afternoon after 2pm. Pt denies any new health issues at this time.

## 2018-07-16 ENCOUNTER — Telehealth: Payer: Self-pay | Admitting: *Deleted

## 2018-07-16 NOTE — Telephone Encounter (Signed)
Called pt to inform her of her appt for ldct screening on Wednesday 07/31/2018 @ 2:45pm here @ [facility], voiced understanding, appt mailed per pts request.

## 2018-07-16 NOTE — Telephone Encounter (Signed)
Called patient to inform her of her appt for ldct screening on Monday 07/29/2018 here @ [place] @ 2:15pm, pt could not do that appointment

## 2018-07-29 ENCOUNTER — Ambulatory Visit: Payer: Medicare Other

## 2018-07-31 ENCOUNTER — Ambulatory Visit
Admission: RE | Admit: 2018-07-31 | Discharge: 2018-07-31 | Disposition: A | Discharge status: Home / Self Care | Payer: Medicare Other | Source: Ambulatory Visit | Attending: Oncology | Admitting: Oncology

## 2018-07-31 ENCOUNTER — Ambulatory Visit: Payer: MEDICARE | Primary: DO

## 2018-07-31 DIAGNOSIS — Z122 Encounter for screening for malignant neoplasm of respiratory organs: Secondary | ICD-10-CM | POA: Diagnosis present

## 2018-08-01 ENCOUNTER — Encounter: Payer: Self-pay | Admitting: *Deleted

## 2018-08-05 ENCOUNTER — Encounter: Payer: MEDICARE | Primary: DO

## 2018-08-05 ENCOUNTER — Inpatient Hospital Stay: Admit: 2018-08-05 | Payer: MEDICARE | Attending: DO | Primary: DO

## 2018-08-05 DIAGNOSIS — E559 Vitamin D deficiency, unspecified: Secondary | ICD-10-CM

## 2018-08-06 NOTE — Telephone Encounter (Signed)
I spoke with [name], she is aware of results, placed in recall and call was mutually ended.

## 2018-08-06 NOTE — Telephone Encounter (Signed)
-----   Message from Carmell Austria, DO sent at 08/05/2018  7:46 PM PST -----  WNL. REPEAT IN 5 yrs.

## 2018-08-10 ENCOUNTER — Encounter: Payer: Self-pay | Admitting: *Deleted

## 2018-08-16 ENCOUNTER — Encounter: Payer: Self-pay | Admitting: *Deleted

## 2018-10-17 NOTE — Telephone Encounter (Signed)
Reached out to the patient to inquire how they are doing and to ensure they have all prescriptions they need during this Covid-19 pandemic. Advised them that we are offering telephone/video visits at this time should they need an appointment.   LM

## 2018-12-13 MED ORDER — phenelzine (NARDIL) 15 mg tablet
15 | ORAL_TABLET | ORAL | 0 refills | Status: DC
Start: 2018-12-13 — End: 2018-12-16

## 2018-12-13 NOTE — Telephone Encounter (Signed)
Ramina called stating that [facility] pharmacy is unable to refill her medication phenelzine 15 mg because they are out of stock and do not know when they will receive more. She called several other pharmacies to see if she can pick up elsewhere but has not had any luck. She has called [facility], [address]. I told her that I would call a few more pharmacies.

## 2018-12-13 NOTE — Telephone Encounter (Signed)
[facility] said they do not have in stock but can order and have delivered by Monday. [name] is aware. Order sent to [facility]

## 2018-12-16 ENCOUNTER — Ambulatory Visit: Admit: 2018-12-16 | Payer: MEDICARE | Attending: DO | Primary: DO

## 2018-12-16 DIAGNOSIS — F3341 Major depressive disorder, recurrent, in partial remission: Secondary | ICD-10-CM

## 2018-12-16 MED ORDER — selegiline (ELDEPRYL) 5 mg capsule
5 | ORAL_CAPSULE | ORAL | 3 refills | Status: DC
Start: 2018-12-16 — End: 2019-05-09

## 2018-12-16 MED ORDER — selegiline (EMSAM) 9 mg/24 hr
9 | MEDICATED_PATCH | Freq: Every day | TRANSDERMAL | 3 refills | Status: DC
Start: 2018-12-16 — End: 2018-12-16

## 2018-12-16 NOTE — Telephone Encounter (Signed)
appt scheduled today 6/29 @ 1445

## 2018-12-16 NOTE — Progress Notes (Signed)
[name] is a 68 y.o. female seen today for Depression and Review Medication  .    SUBJECTIVE:  HPI  [name] presents today to follow up depression and review medications.    Phenelzine on b/o, pharmacy is unsure when they will receive. She has called multiple pharmacies. She took her last dose last night. She normally takes 2 in the morning and 3 at night.     The following portions of the patient's history were reviewed and updated as appropriate: allergies, current medications, past family history, past medical history, past surgical history and problem list.    Current Outpatient Medications on File Prior to Visit   Medication Sig Dispense Refill   . cholecalciferol, vitamin D3, 5,000 unit capsule Take 1 capsule by mouth daily. 1 capsule 0   . lovastatin (MEVACOR) 40 MG tablet TAKE ONE TABLET BY MOUTH NIGHTLY AT BEDTIME 90 tablet 2     No current facility-administered medications on file prior to visit.        Allergies   Allergen Reactions   . Nitrous Oxide Nausea And Vomiting   . Codeine Nausea And Vomiting       Past Medical History:   Diagnosis Date   . Bronchitis    . Depression     years ago   . GERD (gastroesophageal reflux disease)     intermittent-no Rx   . Heart murmur     ?   [name]        Past Surgical History:   Procedure Laterality Date   . BREAST CYST ASPIRATION Left    . DILATION AND CURETTAGE OF UTERUS     . PONV after dental     . PROCEDURE Left 11/03/2011    Procedure: OPEN REDUCTION INTERNAL FIXATION, BIMALLEOLAR  ANKLE;  Surgeon: [name], MD;  Location: [facility] OR;  Service: Orthopedics;  Laterality: Left;   . tonsils         Family History   Problem Relation Age of Onset   . Alzheimer's disease Mother    . Arthritis Father    . Lupus Sister    . Diabetes Brother    . No Known Health Problems Sister        Social History     Socioeconomic History   . Marital status: Married     Spouse name: [name]   . Number of children: 4   . Years of education: Not on file   . Highest  education level: Not on file   Tobacco Use   . Smoking status: Former Smoker     Packs/day: 0.50     Years: 25.00     Pack years: 12.50     Types: Cigarettes     Start date: 1989     Last attempt to quit: 06/02/2018     Years since quitting: 0.5   . Smokeless tobacco: Never Used   Substance and Sexual Activity   . Alcohol use: No   . Drug use: No   . Sexual activity: Yes     Partners: Male   Other Topics Concern   . Caffeine Concern Yes     Comment: occasional Pepsi   . Exercise No       REVIEW OF SYSTEMS:  Review of Systems   HENT: Negative for congestion and sore throat.    Respiratory: Negative for cough.    Cardiovascular: Negative for chest pain.   Gastrointestinal: Positive for diarrhea.   Genitourinary: Negative.  Neurological: Negative for dizziness and headaches.       OBJECTIVE:  BP 136/80   Pulse 88   Temp 36.9 ?C (98.5 ?F) (Oral)   Ht 5' 3 (1.6 m)   Wt 223 lb (101.2 kg)   SpO2 94%   BMI 39.50 kg/m?   Body mass index is 39.5 kg/m?[name]    PHYSICAL EXAM:  Gen: no acute distress, nontoxic, alert, oriented, well nourished.  Neck: supple without nodes, JVD, bruits, or thyromegaly.  Resp: good air movement, no retractions, symmetrical breath sounds, without rales, rhonchi, or wheezing  CV: RRR, normal S1, S2; no murmur or gallop  Neuro: CN II-XII intact, A&O x 3, no sensory deficits, DTR's 2/4 upper and lower extremities, and no weakness.  Psych: normal affect w/o depression or anxiety. No suicidal or homicidal ideation or plan.       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 Otherwise pill version. Strict diet warnings given. Eventual wean off MAO-inh recommended. Warned of not stopping abruptly. Pt aware. Plant-based diet, exercise, and weight loss recommended. Warnings regarding increased morbidity and mortality with being overweight.    I had a  thoughtful and careful discussion regarding the issues today. All questions were answered. I educated and reassured. I encouraged follow up as needed.       This note was transcribed using speech recognition software. Please contact us for clarification if any questions arise relating to the wording of this document.

## 2018-12-16 NOTE — Telephone Encounter (Signed)
[name] was told that they are backordered until July. [name] wants to know if Dr.Mcmahon can prescribe something similar to phenelzine (NARDIL) 15 mg tablet?

## 2018-12-16 NOTE — Telephone Encounter (Signed)
Selegiline 5 mg capsules at [facility] is $158.30 for a 30 day supply. [name] is concerned about starting this medication. It was already sent to [facility] and will be delivered by Wednesday.

## 2018-12-16 NOTE — Telephone Encounter (Signed)
OV

## 2018-12-17 NOTE — Telephone Encounter (Signed)
I called [facility] to confirm that this medication was ordered and being delivered tomorrow. They said it should arrive by tomorrow (Wednesday 7/1) and they will contact the patient when it becomes available.

## 2018-12-19 ENCOUNTER — Ambulatory Visit: Admit: 2018-12-19 | Discharge: 2018-12-23 | Payer: MEDICARE | Attending: DO | Primary: DO

## 2018-12-19 DIAGNOSIS — F3341 Major depressive disorder, recurrent, in partial remission: Secondary | ICD-10-CM

## 2018-12-19 MED ORDER — tranylcypromine (PARNATE) 10 mg tablet
10 | ORAL_TABLET | ORAL | 2 refills | Status: DC
Start: 2018-12-19 — End: 2019-03-31

## 2018-12-19 NOTE — Progress Notes (Signed)
[name] is a 68 y.o. female seen today for Depression  .    SUBJECTIVE:  HPI  [name] presents today to follow up depression and review medications. At lst appointment on 12/16/2018 she was prescribed Selegiline and taken off of Phenelzine sulfate. She did not pick up the new medication, due to her research and states it's for Parkinson's disease however, she did pick up the Phenelzine. She is afraid to stop taking Phenelzine and start a new medication. Cannot find phenelzine anywhere other than 1 month supple from [facility]. Has taken this med x 30 years. Tried SSRI's and other med previously (does not remember names).    Would like to discuss referral to Psych.    She is accompanied today by her husband, [name].     The following portions of the patient's history were reviewed and updated as appropriate: allergies, current medications, past family history, past medical history, past surgical history and problem list.    Current Outpatient Medications on File Prior to Visit   Medication Sig Dispense Refill   . cholecalciferol, vitamin D3, 5,000 unit capsule Take 1 capsule by mouth daily. 1 capsule 0   . lovastatin (MEVACOR) 40 MG tablet TAKE ONE TABLET BY MOUTH NIGHTLY AT BEDTIME 90 tablet 2   . selegiline (ELDEPRYL) 5 mg capsule 1 qam and 1 q noon prior to meals 60 capsule 3     No current facility-administered medications on file prior to visit.        Allergies   Allergen Reactions   . Nitrous Oxide Nausea And Vomiting   . Codeine Nausea And Vomiting       Past Medical History:   Diagnosis Date   . Bronchitis    . Depression     years ago   . GERD (gastroesophageal reflux disease)     intermittent-no Rx   . Heart murmur     ?   [name]        Past Surgical History:   Procedure Laterality Date   . BREAST CYST ASPIRATION Left    . DILATION AND CURETTAGE OF UTERUS     . PONV after dental     . PROCEDURE Left 11/03/2011    Procedure: OPEN REDUCTION INTERNAL FIXATION, BIMALLEOLAR  ANKLE;  Surgeon: [name], MD;  Location: [facility] OR;  Service: Orthopedics;  Laterality: Left;   . tonsils         Family History   Problem Relation Age of Onset   . Alzheimer's disease Mother    . Arthritis Father    . Lupus Sister    . Diabetes Brother    . No Known Health Problems Sister        Social History     Socioeconomic History   . Marital status: Married     Spouse name: [name]   . Number of children: 4   . Years of education: Not on file   . Highest education level: Not on file   Tobacco Use   . Smoking status: Former Smoker     Packs/day: 0.50     Years: 25.00     Pack years: 12.50     Types: Cigarettes     Start date: 1989     Last attempt to quit: 06/02/2018     Years since quitting: 0.5   . Smokeless tobacco: Never Used   Substance and Sexual Activity   . Alcohol use: No   . Drug use:  No   . Sexual activity: Yes     Partners: Male   Other Topics Concern   . Caffeine Concern Yes     Comment: occasional Pepsi   . Exercise No       REVIEW OF SYSTEMS:  Review of Systems   Constitutional: Positive for fatigue. Negative for chills and fever.   HENT: Negative for congestion, ear pain, postnasal drip, sinus pressure, sinus pain, sneezing, sore throat, tinnitus, trouble swallowing and voice change.    Respiratory: Positive for shortness of breath. Negative for cough, chest tightness and wheezing.    Cardiovascular: Negative for chest pain, palpitations and leg swelling.   Gastrointestinal: Positive for nausea. Negative for abdominal pain, constipation, diarrhea and vomiting.   Musculoskeletal: Positive for back pain and myalgias.   Neurological: Positive for headaches. Negative for dizziness and light-headedness.   Psychiatric/Behavioral: Positive for sleep disturbance. The patient is nervous/anxious.        OBJECTIVE:  BP 132/86   Pulse 96   Temp 36.8 ?C (98.2 ?F) (Oral)   Resp 18   Ht 5' 3 (1.6 m)   Wt 233 lb (105.7 kg)   SpO2 94%   BMI 41.27 kg/m?   Body mass index is 41.27 kg/m?[name]    PHYSICAL EXAM:  Gen: no acute  distress, nontoxic, alert, oriented, well nourished.  Psych: normal affect w/o depression or anxiety. No suicidal or homicidal ideation or plan.       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 Psyc, IFTI.  I had a thoughtful and careful discussion regarding the issues today. All questions were answered. I educated and reassured. I encouraged follow up as needed.       This note was transcribed using speech recognition software. Please contact us for clarification if any questions arise relating to the wording of this document.

## 2018-12-19 NOTE — Telephone Encounter (Signed)
[name] received her medication nardil from [facility] pharmacy yesterday. She has 150 pills. She is concerned about staying on this medication and would like to discuss a referral to psych to discuss taper and possible alternatives.

## 2019-01-10 IMAGING — US US FNA BIOPSY THYROID 1ST LESION
1 series · 12 of 12 positions shown · non-contrast
Comparison: Thyroid ultrasound 09/18/2017

MEDICATIONS:
None

COMPLICATIONS:
None immediate.

INDICATION: Dominant left thyroid nodule.  Nodule meets criteria for biopsy.

EXAM:
ULTRASOUND GUIDED FINE NEEDLE ASPIRATION OF INDETERMINATE THYROID
NODULE
TECHNIQUE: Informed written consent was obtained from the patient after a
discussion of the risks, benefits and alternatives to treatment.
Questions regarding the procedure were encouraged and answered. A
timeout was performed prior to the initiation of the procedure.

[Series 1: us fna biopsy thyroid 1st lesion · 12 of 12 slices shown]
[im 1/12]
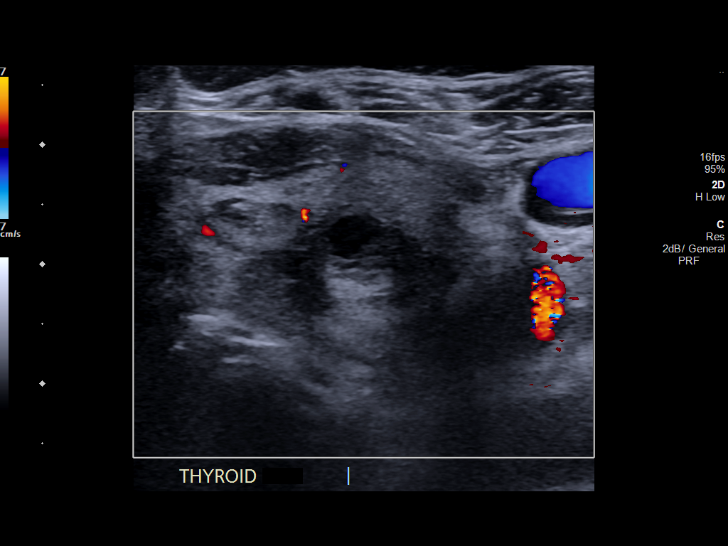
[im 2/12]
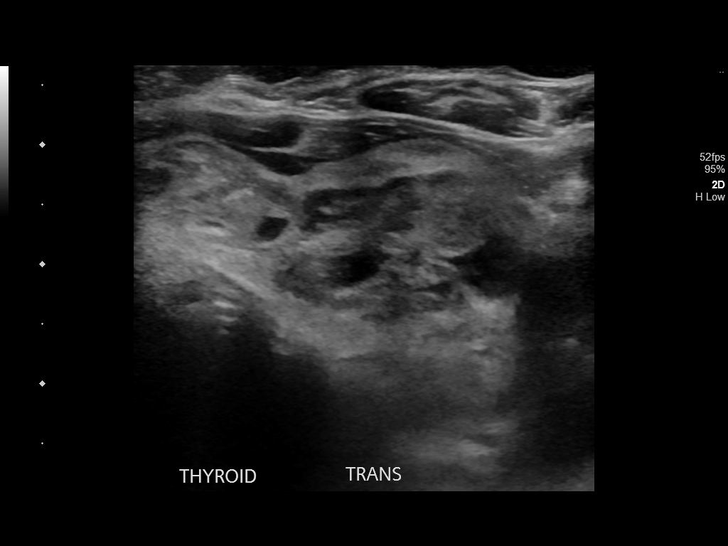
[im 3/12]
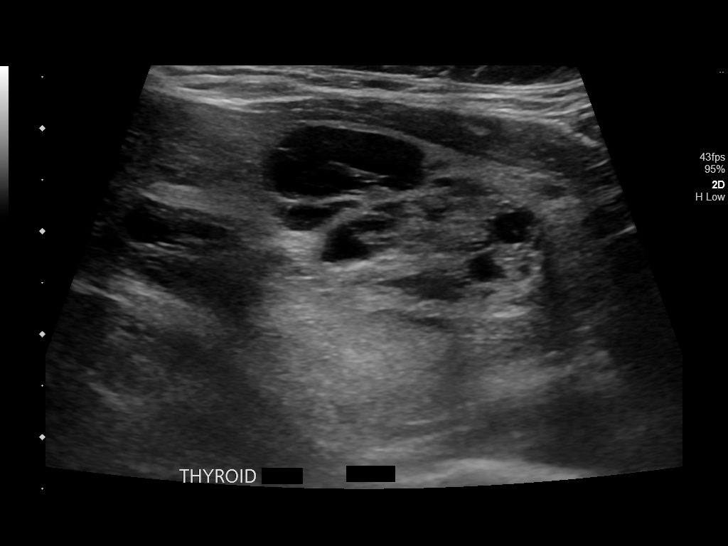
[im 4/12]
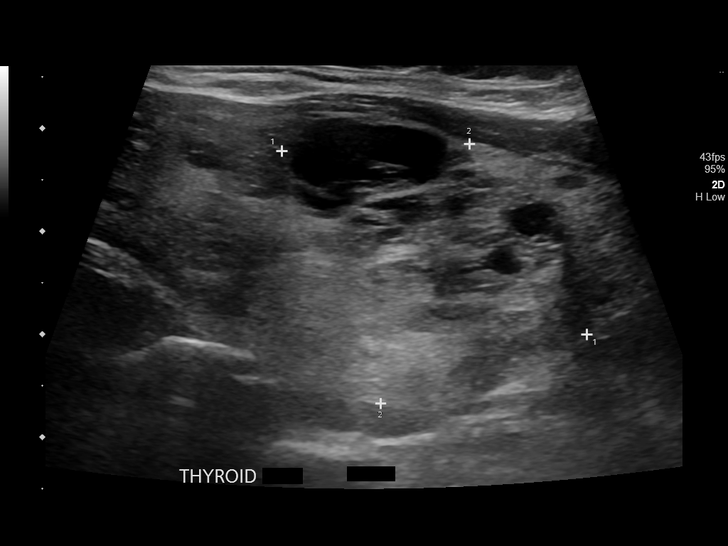
[im 5/12]
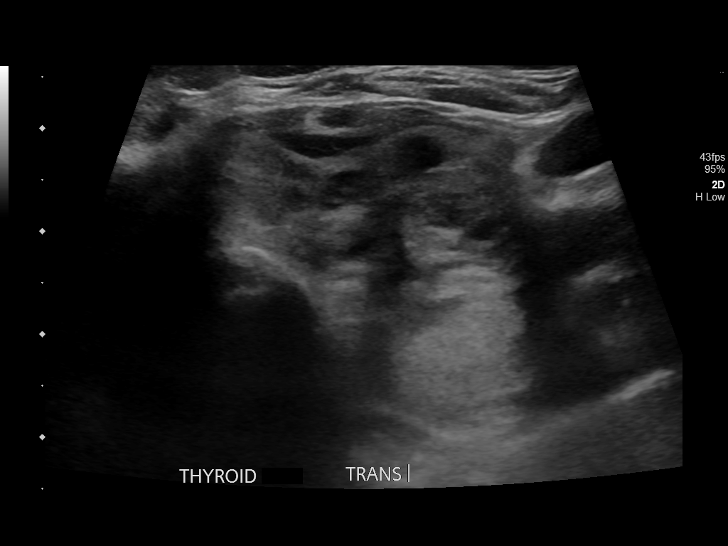
[im 6/12]
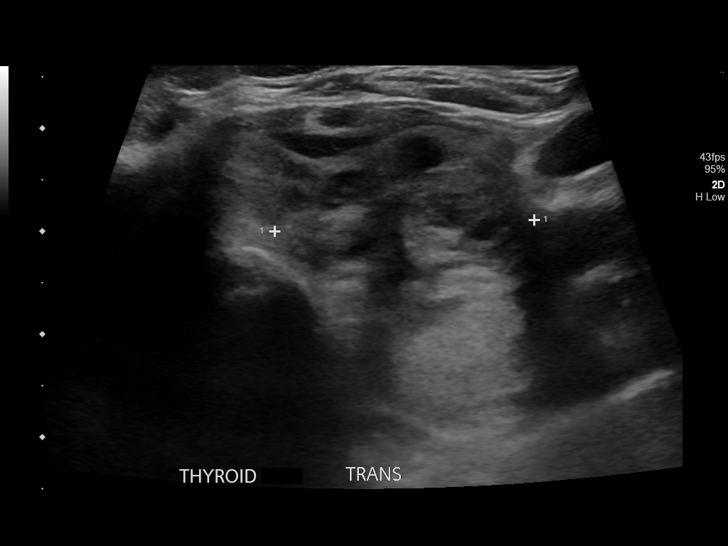
[im 7/12]
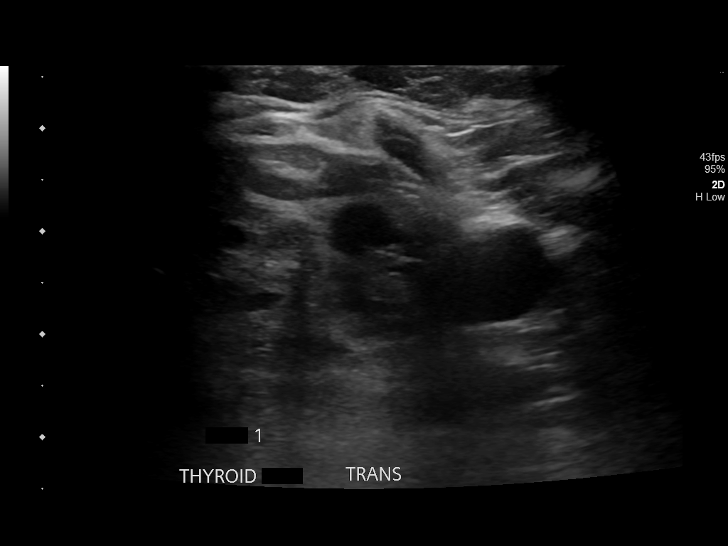
[im 8/12]
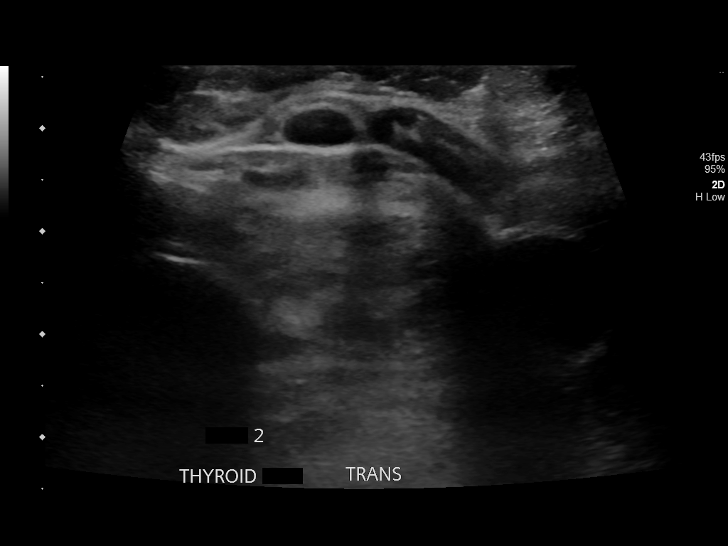
[im 9/12]
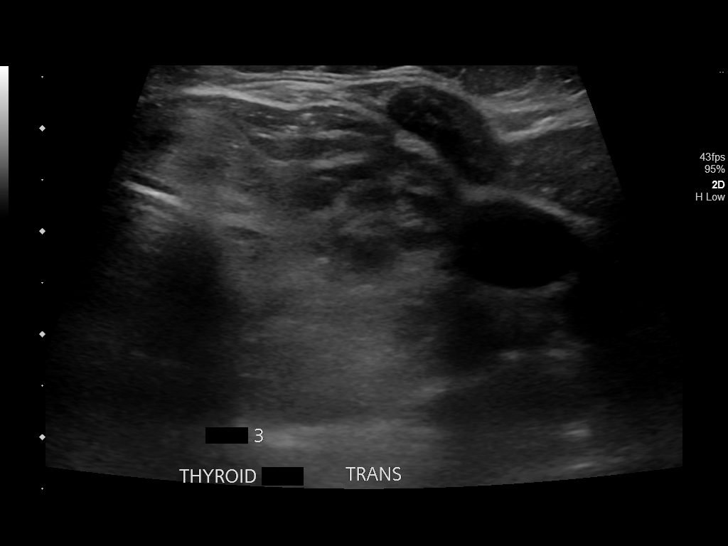
[im 10/12]
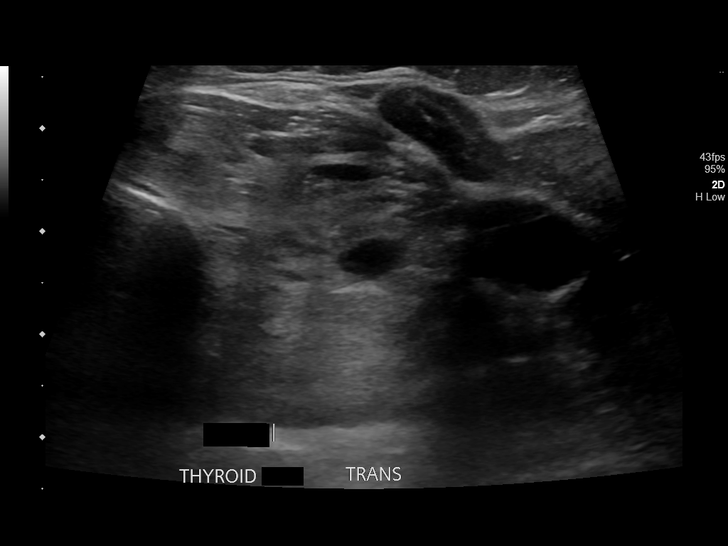
[im 11/12]
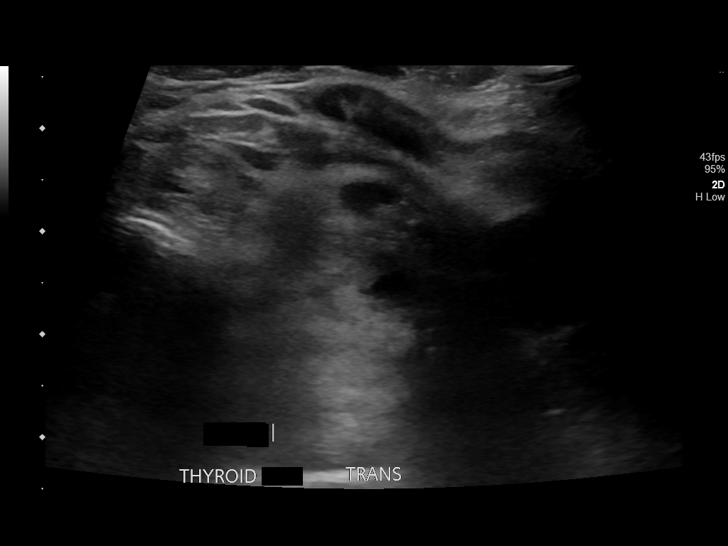
[im 12/12]
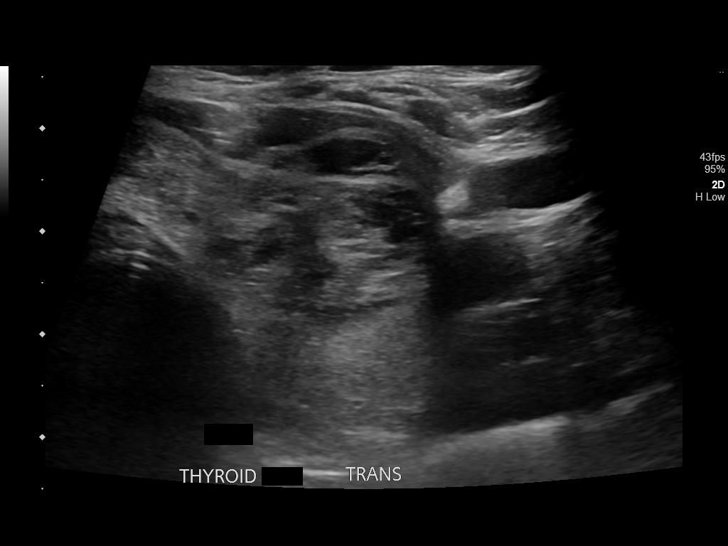

[12 of 12 positions shown; findings below may reference images not displayed]

Pre-procedural ultrasound scanning demonstrated a very heterogeneous
left thyroid lobe. Difficult to differentiate a large heterogeneous
nodule from a conglomeration of nodules. Nodule was thought to
measure 3.5 x 2.7 x 2.5 cm on today's imaging.

The procedure was planned. The neck was prepped in the usual sterile
fashion, and a sterile drape was applied covering the operative
field. A timeout was performed prior to the initiation of the
procedure. Local anesthesia was provided with 1% lidocaine.

Under direct ultrasound guidance, 5 FNA biopsies were performed of
the left thyroid nodule with 25 gauge needles. Two of the FNAs were
collected for Afirma. Multiple ultrasound images were saved for
procedural documentation purposes. The samples were prepared and
submitted to pathology.

Limited post procedural scanning was negative for hematoma or
additional complication. Dressings were placed. The patient
tolerated the above procedures procedure well without immediate
postprocedural complication.
FINDINGS: Nodule reference number based on prior diagnostic ultrasound: 1

Maximum size: 3.5 cm

Location: Left; Mid

ACR TI-RADS risk category: TR4 (4-6 points)

Reason for biopsy: meets ACR TI-RADS criteria

Ultrasound imaging confirms appropriate placement of the needles
within the thyroid nodule.
IMPRESSION: Technically successful ultrasound guided fine needle aspiration of
heterogeneous left thyroid nodule.

## 2019-05-06 NOTE — Telephone Encounter (Signed)
[name] called in stating she would like a flu vaccine. Her grandson had Covid about 3-4 weeks ago. She became ill about 3 weeks ago. She was sick for 2 weeks. States she has been good for the last 2 week. Denies cough, congestion, sore throat or fever. Requesting to come in for a flu vaccine.     She is also due for OV and fasting lab work. She will complete lab work tomorrow. Scheduled appt for Friday 05/09/2019 at 2:30.

## 2019-05-07 ENCOUNTER — Other Ambulatory Visit: Admit: 2019-05-07 | Discharge: 2019-05-07 | Payer: MEDICARE | Primary: DO

## 2019-05-07 DIAGNOSIS — E782 Mixed hyperlipidemia: Secondary | ICD-10-CM

## 2019-05-07 LAB — COMPREHENSIVE METABOLIC PANEL
ALT - Alanine Aminotransferase: 27 IU/L (ref 7–52)
AST - Aspartate Aminotransferase: 30 IU/L (ref 8–39)
Albumin/Globulin Ratio: 1.5 (ref >0.9)
Albumin: 4.4 g/dL (ref 3.5–5.0)
Alkaline Phosphatase: 80 IU/L (ref 34–104)
Anion Gap: 9 mmol/L (ref 4–13)
BUN: 11 mg/dL (ref 6–23)
Bilirubin Total: 0.5 mg/dL (ref 0.3–1.2)
CO2 - Carbon Dioxide: 26 mmol/L (ref 21–31)
Calcium: 9.7 mg/dL (ref 8.6–10.3)
Chloride: 107 mmol/L (ref 98–111)
Creatinine: 0.89 mg/dL (ref 0.55–1.10)
GFR Estimated Female: >60 mL/min/{1.73_m2} (ref >=60)
Globulin: 3 g/dL (ref 2.2–3.7)
Glucose: 102 mg/dL — ABNORMAL HIGH (ref 80–99)
Potassium: 4.1 mmol/L (ref 3.5–5.1)
Protein Total: 7.4 g/dL (ref 6.0–8.0)
Sodium: 142 mmol/L (ref 135–143)

## 2019-05-07 LAB — CBC WITH AUTO DIFFERENTIAL
Basophils %: 1 % (ref 0–2)
Basophils, Absolute: 0.1 10*3/??L (ref 0.0–0.2)
Eosinophils %: 4 % (ref 0–7)
Eosinophils, Absolute: 0.2 10*3/ÂµL (ref 0.0–0.7)
HCT: 39.9 % (ref 37.0–48.0)
Hemoglobin: 13.5 g/dL (ref 12.0–16.0)
Lymphocytes %: 44 % (ref 25–45)
Lymphocytes, Absolute: 2.6 10*3/??L (ref 1.1–4.3)
MCH: 28.7 pg (ref 27.0–34.0)
MCHC: 33.8 g/dL (ref 32.0–36.0)
MCV: 85 fL (ref 81.0–99.0)
MPV: 8.1 fL (ref 7.4–10.4)
Monocytes %: 10 % (ref 0–12)
Monocytes, Absolute: 0.6 10*3/ÂµL (ref 0.0–1.2)
Neutrophils %: 41 % (ref 35–70)
Neutrophils, Absolute: 2.4 10*3/ÂµL (ref 1.6–7.3)
Platelet Count: 194 10*3/ÂµL (ref 150–400)
RBC: 4.69 10*6/ÂµL (ref 4.20–5.40)
RDW: 14.7 % — ABNORMAL HIGH (ref 11.5–14.5)
WBC: 5.9 10*3/ÂµL (ref 4.8–10.8)

## 2019-05-07 LAB — CORONARY RISK LIPID PANEL REFLEX DIRECT LDL
Cholesterol, HDL: 37 mg/dL — ABNORMAL LOW (ref >=40)
Cholesterol: 192 mg/dL (ref <200)
LDL Calculated: 117 mg/dL — ABNORMAL HIGH (ref <100)
Triglyceride: 192 mg/dL — ABNORMAL HIGH (ref 30–149)

## 2019-05-07 LAB — TSH: TSH - Thyroid Stimulating Hormone: 4.45 u[IU]/mL (ref 0.45–5.33)

## 2019-05-09 ENCOUNTER — Ambulatory Visit: Admit: 2019-05-09 | Payer: MEDICARE | Attending: DO | Primary: DO

## 2019-05-09 DIAGNOSIS — B86 Scabies: Secondary | ICD-10-CM

## 2019-05-09 MED ORDER — permethrin (ELIMITE) 5 % cream
5 | TOPICAL | 1 refills | Status: DC
Start: 2019-05-09 — End: 2021-09-26

## 2019-05-09 NOTE — Telephone Encounter (Signed)
-----   Message from Carmell Austria, DO sent at 05/09/2019  4:46 PM PST -----  Pt informed at OV. Repeat lipids in 6 mos. Rest 1 yr.

## 2019-05-09 NOTE — Progress Notes (Signed)
[name] is a 68 y.o. female seen today for Depression  .    SUBJECTIVE:  HPI  [name] presents for depression.   Labs done 05/07/19.  She states that she is doing well on her current medication.    She thinks that she has scabies from her granddaughter.   Her daughter was diagnosed with it.   She is around them a lot.  She has them on her lower legs, arms, back and butt.    She would like to get her flu shot.   She is also due for her pneumovax 23.       The following portions of the patient's history were reviewed and updated as appropriate: allergies, current medications, past family history, past medical history, past surgical history and problem list.    Current Outpatient Medications on File Prior to Visit   Medication Sig Dispense Refill   . cholecalciferol, vitamin D3, 5,000 unit capsule Take 1 capsule by mouth daily. 1 capsule 0   . lovastatin (MEVACOR) 40 MG tablet TAKE ONE TABLET BY MOUTH NIGHTLY AT BEDTIME 90 tablet 2     No current facility-administered medications on file prior to visit.        Allergies   Allergen Reactions   . Nitrous Oxide Nausea And Vomiting   . Codeine Nausea And Vomiting       Past Medical History:   Diagnosis Date   . Bronchitis    . Depression     years ago   . GERD (gastroesophageal reflux disease)     intermittent-no Rx   . Heart murmur     ?   [name]        Past Surgical History:   Procedure Laterality Date   . BREAST CYST ASPIRATION Left    . DILATION AND CURETTAGE OF UTERUS     . PONV after dental     . PROCEDURE Left 11/03/2011    Procedure: OPEN REDUCTION INTERNAL FIXATION, BIMALLEOLAR  ANKLE;  Surgeon: [name], MD;  Location: [facility] OR;  Service: Orthopedics;  Laterality: Left;   . tonsils         Family History   Problem Relation Age of Onset   . Alzheimer's disease Mother    . Arthritis Father    . Lupus Sister    . Diabetes Brother    . No Known Health Problems Sister        Social History     Socioeconomic History   . Marital status: Married        Spouse name: [name]   . Number of children: 4   . Years of education: Not on file   . Highest education level: Not on file   Tobacco Use   . Smoking status: Former Smoker     Packs/day: 0.50     Years: 25.00     Pack years: 12.50     Types: Cigarettes     Start date: 1989     Quit date: 06/02/2018     Years since quitting: 0.9   . Smokeless tobacco: Never Used   Substance and Sexual Activity   . Alcohol use: No   . Drug use: No   . Sexual activity: Yes     Partners: Male   Other Topics Concern   . Caffeine Concern Yes     Comment: occasional Pepsi   . Exercise No       REVIEW OF SYSTEMS:  Review of Systems   Constitutional: Negative for chills, diaphoresis, fatigue and fever.   HENT: Negative for sinus pain and sore throat.    Respiratory: Negative for cough and shortness of breath.    Psychiatric/Behavioral: Positive for dysphoric mood.       OBJECTIVE:  Pulse 83   Temp 36.8 ?C (98.3 ?F) (Oral)   Ht 5' 2.5 (1.588 m)   Wt 172 lb 6.4 oz (78.2 kg)   SpO2 95%   BMI 31.03 kg/m?   Body mass index is 31.03 kg/m?[name]    PHYSICAL EXAM:  Gen: no acute distress, nontoxic, alert, oriented, well nourished.  Resp: good air movement, no retractions, symmetrical breath sounds, without rales, rhonchi, or wheezing  CV: RRR, normal S1, S2; no murmur or gallop  Ext: no cyanosis, edema, or varicose veins. Good pulses throughout.  Psych: normal affect w/o depression or anxiety. No suicidal or homicidal ideation or plan.   Skin: papules and excoriations on legs, belt line and in between fingers.      ASSESSMENT & PLAN:  (B86) Scabies  (primary encounter diagnosis)    (F33.42) Recurrent major depressive disorder, in full remission (HCC)    (E78.2) Mixed hyperlipidemia    (Z23) Flu vaccine need  Plan: Influenza High Dose Trivalent (65 +)    (Z23) Vaccine for streptococcus pneumoniae and influenza  Plan: Pneumococcal polysaccharide vaccine 23-valent         greater than or equal to 2yo subcutaneous/IM    Continue with Parnate with  warnings. Permethrin and linen hygiene. Fly and Pneumovax. Serial labs. Continue with lovastatin.    I had a thoughtful and careful discussion regarding the issues today. All questions were answered. I educated and reassured. I encouraged follow up as needed.       This note was transcribed using speech recognition software. Please contact us for clarification if any questions arise relating to the wording of this document.

## 2019-05-11 MED ORDER — tranylcypromine (PARNATE) 10 mg tablet
10 | ORAL_TABLET | ORAL | 2 refills | Status: DC
Start: 2019-05-11 — End: 2019-08-04

## 2019-06-17 NOTE — Telephone Encounter (Signed)
Patient aware she is due now for repeat mammogram. Order in epic

## 2019-06-17 NOTE — Telephone Encounter (Signed)
-----   Message from Jetty Duhamel, [place] sent at 06/17/2018  9:21 AM PST -----  mammo

## 2019-07-28 ENCOUNTER — Telehealth: Payer: Self-pay | Admitting: *Deleted

## 2019-07-28 DIAGNOSIS — Z87891 Personal history of nicotine dependence: Secondary | ICD-10-CM

## 2019-07-28 NOTE — Telephone Encounter (Signed)
Patient has been notified that annual lung cancer screening low dose CT scan is due currently or will be in near future. Confirmed that patient is within the age range of 55-77, and asymptomatic, (no signs or symptoms of lung cancer). Patient denies illness that would prevent curative treatment for lung cancer if found. Verified smoking history, (current, 48 pack year). The shared decision making visit was done 07/26/17. Patient is agreeable for CT scan being scheduled.

## 2019-08-04 NOTE — Telephone Encounter (Signed)
Last office visit 05/09/2019, labs last done 05/07/2019

## 2019-08-05 MED ORDER — tranylcypromine (PARNATE) 10 mg tablet
10 | ORAL_TABLET | ORAL | 2 refills | Status: DC
Start: 2019-08-05 — End: 2019-08-05

## 2019-08-05 MED ORDER — lovastatin (MEVACOR) 40 MG tablet
40 | ORAL_TABLET | ORAL | 2 refills | 90.00000 days | Status: DC
Start: 2019-08-05 — End: 2020-04-05

## 2019-08-05 MED ORDER — tranylcypromine (PARNATE) 10 mg tablet
10 | ORAL_TABLET | ORAL | 2 refills | Status: DC
Start: 2019-08-05 — End: 2019-08-29

## 2019-08-05 NOTE — Telephone Encounter (Signed)
Last office visit, 05/09/2019. Labs last done 05/07/2019

## 2019-08-06 ENCOUNTER — Ambulatory Visit: Admission: RE | Admit: 2019-08-06 | Payer: Medicare Other | Source: Ambulatory Visit

## 2019-08-22 ENCOUNTER — Ambulatory Visit: Payer: MEDICARE | Primary: DO

## 2019-08-25 ENCOUNTER — Inpatient Hospital Stay: Admit: 2019-08-25 | Payer: MEDICARE | Attending: DO | Primary: DO

## 2019-08-25 DIAGNOSIS — Z1231 Encounter for screening mammogram for malignant neoplasm of breast: Secondary | ICD-10-CM

## 2019-08-26 NOTE — Telephone Encounter (Signed)
LMTCB , need to discuss mammogram results with patient

## 2019-08-26 NOTE — Telephone Encounter (Signed)
-----   Message from Carmell Austria, DO sent at 08/26/2019  5:29 PM PST -----  WNL. REPEAT IN 78yr.

## 2019-08-27 NOTE — Telephone Encounter (Signed)
Relayed lab results, patient understood, had no further questions.     Recall set

## 2019-08-29 ENCOUNTER — Telehealth: Payer: Self-pay | Admitting: *Deleted

## 2019-08-29 NOTE — Telephone Encounter (Signed)
(  08/29/19) Left message for pt to notify them that it is time to schedule annual low dose lung cancer screening CT scan. Instructed patient to call back to verify information prior to the scan being scheduled SRW

## 2019-08-29 NOTE — Telephone Encounter (Signed)
Patient called states she is needing medication refill   .  Requested Prescriptions     Pending Prescriptions Disp Refills   . tranylcypromine (PARNATE) 10 mg tablet [Pharmacy Med Name: tranylcypromine 10 mg tablet] 450 tablet 2     Sig: TAKE TWO TABLETS BY MOUTH IN THE MORNING AND 3 tablets AT BEDTIME.     Informed [facility] just sent request and [facility] team will be working on it . Patient expressed with understanding.

## 2019-08-29 NOTE — Telephone Encounter (Signed)
Error

## 2019-08-29 NOTE — Telephone Encounter (Signed)
Medication Being Requested:    Requested Prescriptions     Pending Prescriptions Disp Refills   . tranylcypromine (PARNATE) 10 mg tablet [Pharmacy Med Name: tranylcypromine 10 mg tablet] 450 tablet 2     Sig: TAKE TWO TABLETS BY MOUTH IN THE MORNING AND 3 tablets AT BEDTIME.         [facility] - [place], [place] - [id number] - [place], [place] - [address]  [address] [place] [address]  Phone: [phone] Fax: [phone]    [facility] [phone] - [place], [place] - [address]  [address]  [place] [address]  Phone: [phone] Fax: [phone]      Future Appointments:  No future appointments.    Last Appointment this Department:  05/09/2019 Carmell Austria, DO    Last routine labs: 05/07/19

## 2019-09-11 ENCOUNTER — Telehealth: Payer: Self-pay

## 2019-09-11 DIAGNOSIS — Z87891 Personal history of nicotine dependence: Secondary | ICD-10-CM

## 2019-09-11 NOTE — Telephone Encounter (Addendum)
Patient has been notified that the low dose lung cancer screening CT scan is due currently or will be in near future.  Confirmed that patient is within the appropriate age range and asymptomatic, (no signs or symptoms of lung cancer).  Patient denies illness that would prevent curative treatment for lung cancer if found.  Verified smoking history (current smoker 1 ppd, 47 year history).  Patient is agreeable for CT scan being scheduled.  Prefers Tuesday after 2:00 appointments.

## 2019-09-19 NOTE — Addendum Note (Signed)
Addended by: Lieutenant Diego on: 09/19/2019 10:30 AM   Modules accepted: Orders

## 2019-09-19 NOTE — Telephone Encounter (Signed)
Smoking history: current 48 pack year

## 2019-09-23 ENCOUNTER — Ambulatory Visit
Admission: RE | Admit: 2019-09-23 | Discharge: 2019-09-23 | Disposition: A | Discharge status: Home / Self Care | Payer: Medicare Other | Source: Ambulatory Visit | Attending: Oncology | Admitting: Oncology

## 2019-09-23 ENCOUNTER — Other Ambulatory Visit: Payer: Self-pay

## 2019-09-23 DIAGNOSIS — Z87891 Personal history of nicotine dependence: Secondary | ICD-10-CM

## 2019-09-25 ENCOUNTER — Encounter: Payer: Self-pay | Admitting: *Deleted

## 2019-10-15 ENCOUNTER — Other Ambulatory Visit: Payer: Self-pay | Admitting: Family Medicine

## 2019-10-15 DIAGNOSIS — Z1231 Encounter for screening mammogram for malignant neoplasm of breast: Secondary | ICD-10-CM

## 2019-11-06 NOTE — Telephone Encounter (Signed)
Lab   Ordered

## 2019-11-06 NOTE — Telephone Encounter (Signed)
-----   Message from Lindalou Hose, [place] sent at 05/09/2019  4:50 PM PST -----  Regarding: Repeat lipids in 6 mos.

## 2019-11-27 ENCOUNTER — Ambulatory Visit
Admission: RE | Admit: 2019-11-27 | Discharge: 2019-11-27 | Disposition: A | Discharge status: Home / Self Care | Payer: Medicare Other | Source: Ambulatory Visit | Attending: Family Medicine | Admitting: Family Medicine

## 2019-11-27 DIAGNOSIS — Z1231 Encounter for screening mammogram for malignant neoplasm of breast: Secondary | ICD-10-CM | POA: Diagnosis present

## 2020-01-05 NOTE — Telephone Encounter (Signed)
Scheduled AWV with Nurse Toni Amend 03/10/2020 at 1:30 PM.  Advised check-in at 1:15 PM.  AWV eligible 07/05/2019.

## 2020-02-18 NOTE — Telephone Encounter (Signed)
Attempt #1, LVM asking Jolayne Branson Hallberg to convert in person appointment to a tele med video visit as we are no longer doing in person visits at this time. Left call back number [phone].

## 2020-02-18 NOTE — Telephone Encounter (Signed)
Patient requests to cancel appointment 03/10/20 and to call back when we are back in clinic.     Spoke with patient's husband.

## 2020-03-10 ENCOUNTER — Encounter: Payer: MEDICARE | Primary: DO

## 2020-04-02 NOTE — Telephone Encounter (Signed)
Patient called for refill. Appointment was scheduled for Monday. She has 4 days of meds left.

## 2020-04-05 ENCOUNTER — Ambulatory Visit: Admit: 2020-04-05 | Discharge: 2020-04-12 | Payer: MEDICARE | Attending: DO | Primary: DO

## 2020-04-05 DIAGNOSIS — E782 Mixed hyperlipidemia: Secondary | ICD-10-CM

## 2020-04-05 MED ORDER — tranylcypromine (PARNATE) 10 mg tablet
10 | ORAL_TABLET | ORAL | 2 refills | Status: DC
Start: 2020-04-05 — End: 2020-04-07

## 2020-04-05 MED ORDER — lovastatin (MEVACOR) 40 MG tablet
40 | ORAL_TABLET | ORAL | 2 refills | 90.00000 days | Status: DC
Start: 2020-04-05 — End: 2021-03-21

## 2020-04-05 NOTE — Progress Notes (Signed)
[name] is a 69 y.o. female seen today for Medication Check (Evaluate medications) and Screening Colonoscopy  .    SUBJECTIVE:  HPI  Patient presents today to discuss medication and lab work to be completed. Due for lipids and annual labs. Depression controlled with tranylcypromine.  The following portions of the patient's history were reviewed and updated as appropriate: allergies, current medications, past family history, past medical history, past surgical history and problem list.    Current Outpatient Medications on File Prior to Visit   Medication Sig Dispense Refill   . cholecalciferol, vitamin D3, 5,000 unit capsule Take 1 capsule by mouth daily. 1 capsule 0   . permethrin (ELIMITE) 5 % cream Apply topically as directed and then repeat 1 week. 60 g 1     No current facility-administered medications on file prior to visit.       Allergies   Allergen Reactions   . Nitrous Oxide Nausea And Vomiting   . Codeine Nausea And Vomiting       Past Medical History:   Diagnosis Date   . Bronchitis    . Depression     years ago   . GERD (gastroesophageal reflux disease)     intermittent-no Rx   . Heart murmur     ?   [name]        Past Surgical History:   Procedure Laterality Date   . BREAST CYST ASPIRATION Left    . DILATION AND CURETTAGE OF UTERUS     . PONV after dental     . PROCEDURE Left 11/03/2011    Procedure: OPEN REDUCTION INTERNAL FIXATION, BIMALLEOLAR  ANKLE;  Surgeon: [name], MD;  Location: [facility] OR;  Service: Orthopedics;  Laterality: Left;   . tonsils         Family History   Problem Relation Age of Onset   . Alzheimer's disease Mother    . Arthritis Father    . Lupus Sister    . Diabetes Brother    . No Known Health Problems Sister        Social History     Socioeconomic History   . Marital status: Married     Spouse name: [name]   . Number of children: 4   . Years of education: Not on file   . Highest education level: Not on file   Tobacco Use   . Smoking status: Former Smoker      Packs/day: 0.50     Years: 25.00     Pack years: 12.50     Types: Cigarettes     Start date: 1989     Quit date: 06/02/2018     Years since quitting: 1.8   . Smokeless tobacco: Never Used   Substance and Sexual Activity   . Alcohol use: No   . Drug use: No   . Sexual activity: Yes     Partners: Male   Other Topics Concern   . Caffeine Concern Yes     Comment: occasional Pepsi   . Exercise No       REVIEW OF SYSTEMS:  Review of Systems   Cardiovascular: Negative for chest pain, palpitations and leg swelling.   Gastrointestinal: Negative for abdominal pain.   Psychiatric/Behavioral: Positive for dysphoric mood. Negative for agitation, behavioral problems, confusion, decreased concentration, self-injury, sleep disturbance and suicidal ideas. The patient is not nervous/anxious.        OBJECTIVE:  BP 130/64 (BP Location: Left arm, Patient  Position: Sitting)   Pulse 84   Temp 36.8 ?C (98.3 ?F) (Oral)   Wt 180 lb (81.6 kg)   SpO2 97%   BMI 32.40 kg/m?   Body mass index is 32.4 kg/m?[name]    PHYSICAL EXAM:  Gen: no acute distress, nontoxic, alert, oriented, well nourished.[name]  Resp: good air movement, no retractions, symmetrical breath sounds, without rales, rhonchi, or wheezing  CV: RRR, normal S1, S2; no murmur or gallop  Psych: normal affect w/o depression or anxiety. No suicidal or homicidal ideation or plan.   SASSESSMENT & PLAN:  (E78.2) Mixed hyperlipidemia  (primary encounter diagnosis)    (F33.42) Recurrent major depressive disorder, in full remission (HCC)    (R73.9) Hyperglycemia  Plan: Glyco-Hemoglobin A1C -Routine    (Z23) Need for vaccination  Plan: Flu vaccine >= 65 yrs (FLUAD Quadrivalent ADJ)         (PF) IM, CANCELED: Flu vaccine >= 65 yrs (FLUAD        Quadrivalent ADJ) (PF) IM    (Z12.11) Screening for colon cancer  Plan: Referral to Gastroenterology (Gastroenterology         Consultants)    Immunizations Administered     Name Date Dose VIS Date Route    FLU QUADRIVALENT ADJUVANTED 65+ INJECTABLE (PF)  04/05/2020 0.5 mL 01/23/2020 Intramuscular    Site: Left deltoid    Given By: Fuller Plan, CMA    Documented By: Fuller Plan, CMA    Manufacturer: [employer]    Lot: [id number]    NDC: 25956387564    Expiration Date: 11/09/2020        Continue with current meds. Fasting labs. Flu shot.20 minutes spent in review and/or implementation of prior and current medication, specialist consultations, tests and management, and risks and alternatives to care of conditions.  I had a thoughtful and careful discussion regarding the issues today. All questions were answered. I educated and reassured. I encouraged follow up as needed.       This note was transcribed using speech recognition software. Please contact us for clarification if any questions arise relating to the wording of this document.

## 2020-04-07 NOTE — Telephone Encounter (Signed)
RX sent to [facility]. kk

## 2020-04-07 NOTE — Telephone Encounter (Signed)
Patient called in stating that [facility] had 50 of her Parnate 10 mg pills and states that they are no longer going to carrying the Parnate so they were unable to give her a full prescription. She states that [facility] pharmacy has 100 pills. She states that she needs a prescription sent to [facility] because they will fill her medication.

## 2020-04-08 MED ORDER — tranylcypromine (PARNATE) 10 mg tablet
10 | ORAL_TABLET | ORAL | 2 refills | Status: DC
Start: 2020-04-08 — End: 2020-12-17

## 2020-05-07 MED ORDER — fluticasone propionate (FLONASE) 50 mcg/actuation nasal spray
50 | Freq: Every day | NASAL | 3 refills | 75.00000 days | Status: AC
Start: 2020-05-07 — End: 2020-06-06

## 2020-05-07 NOTE — Telephone Encounter (Signed)
Patient aware and script pended for refill. Patient will call back if fails to improve

## 2020-05-07 NOTE — Telephone Encounter (Signed)
Mucinex bid 1200 mg and can send Rx for Sudafed 60 mg 1 q 8 hrs prn congestion #60 ( if no probs with Sudafed). RTOIW.

## 2020-05-07 NOTE — Telephone Encounter (Signed)
Patient called requesting medication as she has a plugged up nose and cough x 2days. Patient stated no sob or fever. Patient declined a video appointment for Monday. Please advise and give the patient a phone call.    [phone]

## 2020-05-10 NOTE — Telephone Encounter (Signed)
Called and spoke to the patient advised her annual labs are due, she is aware they are fasting labs. She will complete tomorrow.

## 2020-05-10 NOTE — Telephone Encounter (Signed)
-----   Message from [place], [place] sent at 04/05/2020  3:47 PM PDT -----  Glyco, TSH, CMP, CBC and Lipids

## 2020-08-26 NOTE — Telephone Encounter (Signed)
-----   Message from Halesite Prophet sent at 08/27/2019  8:30 AM PST -----  Annual Mammogram

## 2020-08-26 NOTE — Telephone Encounter (Signed)
LMTCB - need to advise pt it is time for her mammogram. Order has been placed.

## 2020-08-29 ENCOUNTER — Telehealth: Payer: Self-pay | Admitting: *Deleted

## 2020-08-29 NOTE — Telephone Encounter (Signed)
Notified patient to inform her that it is time to schedule her annual lung cancer screening CT scan. Patient is a current smoker and she smoke 1 pack of cigarettes per day. Appointment for CT scan is scheduled for 10/07/2020 at 2:00pm. Patient would like a reminder call prior to appointment.

## 2020-08-30 ENCOUNTER — Other Ambulatory Visit: Payer: Self-pay | Admitting: *Deleted

## 2020-08-30 DIAGNOSIS — F17200 Nicotine dependence, unspecified, uncomplicated: Secondary | ICD-10-CM

## 2020-08-30 DIAGNOSIS — Z87891 Personal history of nicotine dependence: Secondary | ICD-10-CM

## 2020-08-30 DIAGNOSIS — Z122 Encounter for screening for malignant neoplasm of respiratory organs: Secondary | ICD-10-CM

## 2020-08-30 NOTE — Progress Notes (Signed)
Contacted and scheduled for annual lung screening scan. Patient is a current smoker with a 49 pack year history.

## 2020-10-07 ENCOUNTER — Other Ambulatory Visit: Payer: Self-pay

## 2020-10-07 ENCOUNTER — Ambulatory Visit
Admission: RE | Admit: 2020-10-07 | Discharge: 2020-10-07 | Disposition: A | Discharge status: Home / Self Care | Payer: Medicare Other | Source: Ambulatory Visit | Attending: Nurse Practitioner | Admitting: Nurse Practitioner

## 2020-10-07 DIAGNOSIS — Z87891 Personal history of nicotine dependence: Secondary | ICD-10-CM | POA: Diagnosis present

## 2020-10-07 DIAGNOSIS — Z122 Encounter for screening for malignant neoplasm of respiratory organs: Secondary | ICD-10-CM | POA: Insufficient documentation

## 2020-10-07 DIAGNOSIS — F17200 Nicotine dependence, unspecified, uncomplicated: Secondary | ICD-10-CM | POA: Insufficient documentation

## 2020-10-13 ENCOUNTER — Ambulatory Visit: Payer: MEDICARE | Primary: DO

## 2020-10-21 ENCOUNTER — Encounter: Payer: Self-pay | Admitting: *Deleted

## 2020-11-04 ENCOUNTER — Ambulatory Visit: Payer: MEDICARE | Primary: DO

## 2020-12-17 NOTE — Telephone Encounter (Signed)
Last ov 04/05/2020  Last lab 05/07/2019

## 2020-12-28 ENCOUNTER — Other Ambulatory Visit: Admit: 2020-12-28 | Discharge: 2020-12-28 | Payer: MEDICARE | Primary: DO

## 2020-12-28 DIAGNOSIS — R739 Hyperglycemia, unspecified: Secondary | ICD-10-CM

## 2020-12-28 LAB — CBC WITH AUTO DIFFERENTIAL
Basophils %: 1 % (ref 0–2)
Basophils, Absolute: 0.1 10*3/??L (ref 0.0–0.2)
Eosinophils %: 4 % (ref 0–7)
Eosinophils, Absolute: 0.3 10*3/??L (ref 0.0–0.7)
HCT: 40.6 % (ref 37.0–48.0)
Hemoglobin: 13.8 g/dL (ref 12.0–16.0)
Lymphocytes %: 35 % (ref 25–45)
Lymphocytes, Absolute: 2.1 10*3/??L (ref 1.1–4.3)
MCH: 28.6 pg (ref 27.0–34.0)
MCHC: 34 g/dL (ref 32.0–36.0)
MCV: 84.1 fL (ref 81.0–99.0)
MPV: 8.1 fL (ref 7.4–10.4)
Monocytes %: 9 % (ref 0–12)
Monocytes, Absolute: 0.5 10*3/??L (ref 0.0–1.2)
Neutrophils %: 51 % (ref 35–70)
Neutrophils, Absolute: 3.1 10*3/??L (ref 1.6–7.3)
Platelet Count: 191 10*3/??L (ref 150–400)
RBC: 4.83 10*6/??L (ref 4.20–5.40)
RDW: 13.7 % (ref 11.5–14.5)
WBC: 6.1 10*3/??L (ref 4.8–10.8)

## 2020-12-28 LAB — COMPREHENSIVE METABOLIC PANEL
ALT - Alanine Aminotransferase: 19 IU/L (ref 7–52)
AST - Aspartate Aminotransferase: 21 IU/L (ref 8–39)
Albumin/Globulin Ratio: 1.6 (ref >0.9)
Albumin: 4.4 g/dL (ref 3.5–5.0)
Alkaline Phosphatase: 70 IU/L (ref 34–104)
Anion Gap: 8 mmol/L (ref 4–13)
BUN: 15 mg/dL (ref 6–23)
Bilirubin Total: 0.6 mg/dL (ref 0.3–1.2)
CO2 - Carbon Dioxide: 26 mmol/L (ref 21–31)
Calcium: 9.4 mg/dL (ref 8.6–10.3)
Chloride: 107 mmol/L (ref 98–111)
Creatinine: 0.84 mg/dL (ref 0.55–1.10)
GFR Estimated Female: >60 mL/min/{1.73_m2} (ref >=60)
Globulin: 2.7 g/dL (ref 2.2–3.7)
Glucose: 98 mg/dL (ref 80–99)
Potassium: 4.2 mmol/L (ref 3.5–5.1)
Protein Total: 7.1 g/dL (ref 6.0–8.0)
Sodium: 141 mmol/L (ref 135–143)

## 2020-12-28 LAB — GLYCO-HEMOGLOBIN A1C
Estimated Average Glucose: 131 mg/dL
Glycohemoglobin (A1c): 6.2 % — ABNORMAL HIGH (ref 4.3–6.1)

## 2020-12-28 LAB — CORONARY RISK LIPID PANEL
Cholesterol, HDL: 45 mg/dL (ref >=40)
Cholesterol: 224 mg/dL — ABNORMAL HIGH (ref <200)
LDL Calculated: 133 mg/dL — ABNORMAL HIGH (ref <100)
Triglyceride: 230 mg/dL — ABNORMAL HIGH (ref 30–149)

## 2020-12-28 LAB — TSH: TSH - Thyroid Stimulating Hormone: 4.04 ??IU/mL (ref 0.45–5.33)

## 2020-12-28 LAB — T4, FREE: T4, Free: 1.1 ng/dL (ref 0.6–1.2)

## 2020-12-28 LAB — 25-OH HYDROXY VITAMIN D, CALCIFEROL, TOTAL OF D2 & D3: Vitamin D 25-OH Total: 49.4 ng/mL (ref 30.0–100.0)

## 2020-12-29 NOTE — Telephone Encounter (Signed)
Ordered. Lab is aware.

## 2020-12-29 NOTE — Telephone Encounter (Signed)
-----   Message from Carmell Austria, DO sent at 12/29/2020  8:08 AM PDT -----  Add Free T4 .

## 2020-12-31 ENCOUNTER — Ambulatory Visit: Admit: 2020-12-31 | Discharge: 2021-01-04 | Payer: MEDICARE | Attending: DO | Primary: DO

## 2020-12-31 DIAGNOSIS — E782 Mixed hyperlipidemia: Secondary | ICD-10-CM

## 2020-12-31 NOTE — Progress Notes (Signed)
[name] is a 70 y.o. female seen today for Review Medication and Follow-up (Lab results)  .    SUBJECTIVE:  HPI  Patient presents today to review medications and follow up on lab results.     The following portions of the patient's history were reviewed and updated as appropriate: allergies, current medications, past family history, past medical history, past surgical history and problem list.    Current Outpatient Medications on File Prior to Visit   Medication Sig Dispense Refill   . cholecalciferol, vitamin D3, 5,000 unit capsule Take 1 capsule by mouth daily. 1 capsule 0   . lovastatin (MEVACOR) 40 MG tablet TAKE ONE TABLET BY MOUTH NIGHTLY AT BEDTIME 90 tablet 2   . tranylcypromine (PARNATE) 10 mg tablet TAKE TWO TABLETS BY MOUTH EVERY MORNING AND TAKE THREE TABLETS BY MOUTH EVERY NIGHT AT BEDTIME 450 tablet 2   . permethrin (ELIMITE) 5 % cream Apply topically as directed and then repeat 1 week. (Patient not taking: Reported on 12/31/2020) 60 g 1     No current facility-administered medications on file prior to visit.       Allergies   Allergen Reactions   . Nitrous Oxide Nausea And Vomiting   . Codeine Nausea And Vomiting       Past Medical History:   Diagnosis Date   . Bronchitis    . Depression     years ago   . GERD (gastroesophageal reflux disease)     intermittent-no Rx   . Heart murmur     ?   [name]        Past Surgical History:   Procedure Laterality Date   . BREAST CYST ASPIRATION Left    . DILATION AND CURETTAGE OF UTERUS     . PONV after dental     . PROCEDURE Left 11/03/2011    Procedure: OPEN REDUCTION INTERNAL FIXATION, BIMALLEOLAR  ANKLE;  Surgeon: [name], MD;  Location: [facility] OR;  Service: Orthopedics;  Laterality: Left;   . tonsils         Family History   Problem Relation Age of Onset   . Alzheimer's disease Mother    . Arthritis Father    . Lupus Sister    . Diabetes Brother    . No Known Health Problems Sister        Social History     Socioeconomic History   . Marital  status: Married     Spouse name: [name]   . Number of children: 4   Tobacco Use   . Smoking status: Former Smoker     Packs/day: 0.50     Years: 25.00     Pack years: 12.50     Types: Cigarettes     Start date: 1989     Quit date: 06/02/2018     Years since quitting: 2.5   . Smokeless tobacco: Never Used   Substance and Sexual Activity   . Alcohol use: No   . Drug use: No   . Sexual activity: Yes     Partners: Male   Other Topics Concern   . Caffeine Concern Yes     Comment: occasional Pepsi   . Exercise No       REVIEW OF SYSTEMS:  Review of Systems   Constitutional: Negative for chills, fatigue and fever.   Respiratory: Negative for cough and shortness of breath.    Cardiovascular: Negative for chest pain.  OBJECTIVE:  BP 130/80 (BP Location: Left arm, Patient Position: Sitting)   Pulse 79   Temp 36.8 ?C (98.2 ?F) (Oral)   Ht 5' 2.5 (1.588 m)   Wt 173 lb (78.5 kg)   SpO2 98%   BMI 31.14 kg/m?   Body mass index is 31.14 kg/m?[name]    PHYSICAL EXAM:  Gen: no acute distress, nontoxic, alert, oriented, well nourished.  Resp: good air movement, no retractions, symmetrical breath sounds, without rales, rhonchi, or wheezing  CV: RRR, normal S1, S2; no murmur or gallop      ASSESSMENT & PLAN:  (E78.2) Mixed hyperlipidemia  (primary encounter diagnosis)    (F33.42) Recurrent major depressive disorder, in full remission (HCC)    CoQ10 and continue with losartan and parnate with warnings.    I had a thoughtful and careful discussion regarding the issues today. All questions were answered. I educated and reassured. I encouraged follow up as needed.       This note was transcribed using speech recognition software. Please contact us for clarification if any questions arise relating to the wording of this document.

## 2020-12-31 NOTE — Telephone Encounter (Signed)
Patient aware of the results and expressed understanding. In recall

## 2020-12-31 NOTE — Telephone Encounter (Signed)
-----   Message from Carmell Austria, DO sent at 12/31/2020  1:26 PM PDT -----  Pt informed at OV. Repeat all 1 yr.

## 2021-03-21 NOTE — Telephone Encounter (Signed)
Last ov 12/31/2020  Last lab 12/28/2020

## 2021-06-21 ENCOUNTER — Institutional Professional Consult (permissible substitution): Admit: 2021-06-21 | Discharge: 2021-06-28 | Payer: MEDICARE | Primary: DO

## 2021-06-21 DIAGNOSIS — Z23 Encounter for immunization: Secondary | ICD-10-CM

## 2021-06-21 NOTE — Progress Notes (Signed)
Flu  Immunizations Administered     Name Date Dose VIS Date Route    FLU 65+yrs QUADRIVALENT ADJUVANTED INJ(PF) 06/21/2021 0.5 mL 01/23/2020 Intramuscular    Site: Right deltoid    Given By: Fuller Plan, CMA    Documented By: Fuller Plan, CMA    Manufacturer: [employer]    Lot: [id number]    NDC: [id number]    Expiration Date: 10/25/2021

## 2021-07-21 ENCOUNTER — Emergency Department: Payer: Medicare Other

## 2021-07-21 ENCOUNTER — Inpatient Hospital Stay
Admission: EM | Admit: 2021-07-21 | Discharge: 2021-07-24 | DRG: 291 | Disposition: A | Discharge status: Home / Self Care | Payer: Medicare Other | Attending: Internal Medicine | Admitting: Internal Medicine

## 2021-07-21 ENCOUNTER — Other Ambulatory Visit: Payer: Self-pay

## 2021-07-21 DIAGNOSIS — D649 Anemia, unspecified: Secondary | ICD-10-CM | POA: Diagnosis not present

## 2021-07-21 DIAGNOSIS — E669 Obesity, unspecified: Secondary | ICD-10-CM | POA: Diagnosis present

## 2021-07-21 DIAGNOSIS — I110 Hypertensive heart disease with heart failure: Principal | ICD-10-CM | POA: Diagnosis present

## 2021-07-21 DIAGNOSIS — Z803 Family history of malignant neoplasm of breast: Secondary | ICD-10-CM

## 2021-07-21 DIAGNOSIS — J9621 Acute and chronic respiratory failure with hypoxia: Secondary | ICD-10-CM | POA: Diagnosis present

## 2021-07-21 DIAGNOSIS — Z79899 Other long term (current) drug therapy: Secondary | ICD-10-CM

## 2021-07-21 DIAGNOSIS — F17200 Nicotine dependence, unspecified, uncomplicated: Secondary | ICD-10-CM | POA: Diagnosis present

## 2021-07-21 DIAGNOSIS — I5033 Acute on chronic diastolic (congestive) heart failure: Secondary | ICD-10-CM | POA: Diagnosis present

## 2021-07-21 DIAGNOSIS — E876 Hypokalemia: Secondary | ICD-10-CM | POA: Diagnosis present

## 2021-07-21 DIAGNOSIS — Z6841 Body Mass Index (BMI) 40.0 and over, adult: Secondary | ICD-10-CM

## 2021-07-21 DIAGNOSIS — Z7982 Long term (current) use of aspirin: Secondary | ICD-10-CM

## 2021-07-21 DIAGNOSIS — R0600 Dyspnea, unspecified: Secondary | ICD-10-CM

## 2021-07-21 DIAGNOSIS — R0602 Shortness of breath: Secondary | ICD-10-CM | POA: Diagnosis not present

## 2021-07-21 DIAGNOSIS — E7800 Pure hypercholesterolemia, unspecified: Secondary | ICD-10-CM | POA: Diagnosis present

## 2021-07-21 DIAGNOSIS — I509 Heart failure, unspecified: Secondary | ICD-10-CM

## 2021-07-21 DIAGNOSIS — I10 Essential (primary) hypertension: Secondary | ICD-10-CM

## 2021-07-21 DIAGNOSIS — E1169 Type 2 diabetes mellitus with other specified complication: Secondary | ICD-10-CM

## 2021-07-21 DIAGNOSIS — F17210 Nicotine dependence, cigarettes, uncomplicated: Secondary | ICD-10-CM | POA: Diagnosis present

## 2021-07-21 DIAGNOSIS — Z20822 Contact with and (suspected) exposure to covid-19: Secondary | ICD-10-CM | POA: Diagnosis present

## 2021-07-21 DIAGNOSIS — J9600 Acute respiratory failure, unspecified whether with hypoxia or hypercapnia: Secondary | ICD-10-CM | POA: Diagnosis present

## 2021-07-21 DIAGNOSIS — D509 Iron deficiency anemia, unspecified: Secondary | ICD-10-CM | POA: Diagnosis present

## 2021-07-21 LAB — CBC WITH DIFFERENTIAL/PLATELET
Abs Immature Granulocytes: 0.06 10*3/uL (ref 0.00–0.07)
Basophils Absolute: 0 10*3/uL (ref 0.0–0.1)
Basophils Relative: 0 %
Eosinophils Absolute: 0 10*3/uL (ref 0.0–0.5)
Eosinophils Relative: 0 %
HCT: 25.1 % — ABNORMAL LOW (ref 36.0–46.0)
Hemoglobin: 6.3 g/dL — ABNORMAL LOW (ref 12.0–15.0)
Immature Granulocytes: 1 %
Lymphocytes Relative: 14 %
Lymphs Abs: 1.5 10*3/uL (ref 0.7–4.0)
MCH: 15.3 pg — ABNORMAL LOW (ref 26.0–34.0)
MCHC: 25.1 g/dL — ABNORMAL LOW (ref 30.0–36.0)
MCV: 61.1 fL — ABNORMAL LOW (ref 80.0–100.0)
Monocytes Absolute: 0.8 10*3/uL (ref 0.1–1.0)
Monocytes Relative: 8 %
Neutro Abs: 8.3 10*3/uL — ABNORMAL HIGH (ref 1.7–7.7)
Neutrophils Relative %: 77 %
Platelets: 394 10*3/uL (ref 150–400)
RBC: 4.11 MIL/uL (ref 3.87–5.11)
RDW: 21.9 % — ABNORMAL HIGH (ref 11.5–15.5)
Smear Review: NORMAL
WBC: 10.8 10*3/uL — ABNORMAL HIGH (ref 4.0–10.5)
nRBC: 0.9 % — ABNORMAL HIGH (ref 0.0–0.2)

## 2021-07-21 LAB — COMPREHENSIVE METABOLIC PANEL
ALT: 86 U/L — ABNORMAL HIGH (ref 0–44)
AST: 74 U/L — ABNORMAL HIGH (ref 15–41)
Albumin: 3.1 g/dL — ABNORMAL LOW (ref 3.5–5.0)
Alkaline Phosphatase: 95 U/L (ref 38–126)
Anion gap: 11 (ref 5–15)
BUN: 16 mg/dL (ref 8–23)
CO2: 27 mmol/L (ref 22–32)
Calcium: 8.5 mg/dL — ABNORMAL LOW (ref 8.9–10.3)
Chloride: 102 mmol/L (ref 98–111)
Creatinine, Ser: 0.88 mg/dL (ref 0.44–1.00)
GFR, Estimated: >60 mL/min (ref >60)
Glucose, Bld: 198 mg/dL — ABNORMAL HIGH (ref 70–99)
Potassium: 3.6 mmol/L (ref 3.5–5.1)
Sodium: 140 mmol/L (ref 135–145)
Total Bilirubin: 1 mg/dL (ref 0.3–1.2)
Total Protein: 6.9 g/dL (ref 6.5–8.1)

## 2021-07-21 LAB — RESP PANEL BY RT-PCR (FLU A&B, COVID) ARPGX2
Influenza A by PCR: NEGATIVE
Influenza B by PCR: NEGATIVE
SARS Coronavirus 2 by RT PCR: NEGATIVE

## 2021-07-21 LAB — HIV ANTIBODY (ROUTINE TESTING W REFLEX): HIV Screen 4th Generation wRfx: NONREACTIVE

## 2021-07-21 LAB — BRAIN NATRIURETIC PEPTIDE: B Natriuretic Peptide: 790.4 pg/mL — ABNORMAL HIGH (ref 0.0–100.0)

## 2021-07-21 LAB — PREPARE RBC (CROSSMATCH)

## 2021-07-21 LAB — TROPONIN I (HIGH SENSITIVITY)
Troponin I (High Sensitivity): 14 ng/L (ref <18)
Troponin I (High Sensitivity): 15 ng/L (ref <18)

## 2021-07-21 LAB — ABO/RH: ABO/RH(D): O POS

## 2021-07-21 LAB — IRON AND TIBC
Iron: 13 ug/dL — ABNORMAL LOW (ref 28–170)
Saturation Ratios: 3 % — ABNORMAL LOW (ref 10.4–31.8)
TIBC: 518 ug/dL — ABNORMAL HIGH (ref 250–450)
UIBC: 505 ug/dL

## 2021-07-21 MED ORDER — ONDANSETRON HCL 4 MG PO TABS: 4.0000 mg | ORAL_TABLET | Freq: Four times a day (QID) | ORAL | Status: DC | PRN

## 2021-07-21 MED ORDER — SODIUM CHLORIDE 0.9% FLUSH: 3.0000 mL | INTRAVENOUS | Status: DC | PRN

## 2021-07-21 MED ORDER — SODIUM CHLORIDE 0.9 % IV SOLN
250.0000 mL | INTRAVENOUS | Status: DC | PRN
  Administered 2021-07-23: 250 mL via INTRAVENOUS

## 2021-07-21 MED ORDER — FUROSEMIDE 10 MG/ML IJ SOLN: 40.0000 mg | Freq: Every day | INTRAMUSCULAR | Status: DC

## 2021-07-21 MED ORDER — NICOTINE 14 MG/24HR TD PT24
14.0000 mg | MEDICATED_PATCH | Freq: Every day | TRANSDERMAL | Status: DC
  Administered 2021-07-22 – 2021-07-24 (×3): 14 mg via TRANSDERMAL
  Filled 2021-07-21 (×3): qty 1

## 2021-07-21 MED ORDER — ACETAMINOPHEN 650 MG RE SUPP: 650.0000 mg | Freq: Four times a day (QID) | RECTAL | Status: DC | PRN

## 2021-07-21 MED ORDER — SODIUM CHLORIDE 0.9% FLUSH
3.0000 mL | Freq: Two times a day (BID) | INTRAVENOUS | Status: DC
  Administered 2021-07-21 – 2021-07-23 (×5): 3 mL via INTRAVENOUS

## 2021-07-21 MED ORDER — ONDANSETRON HCL 4 MG/2ML IJ SOLN: 4.0000 mg | Freq: Four times a day (QID) | INTRAMUSCULAR | Status: DC | PRN

## 2021-07-21 MED ORDER — FUROSEMIDE 10 MG/ML IJ SOLN
60.0000 mg | Freq: Once | INTRAMUSCULAR | Status: AC
  Administered 2021-07-21: 60 mg via INTRAVENOUS
  Filled 2021-07-21: qty 8

## 2021-07-21 MED ORDER — ATORVASTATIN CALCIUM 20 MG PO TABS
20.0000 mg | ORAL_TABLET | Freq: Every evening | ORAL | Status: DC
  Administered 2021-07-21 – 2021-07-23 (×3): 20 mg via ORAL
  Filled 2021-07-21 (×3): qty 1

## 2021-07-21 MED ORDER — ACETAMINOPHEN 325 MG PO TABS: 650.0000 mg | ORAL_TABLET | Freq: Four times a day (QID) | ORAL | Status: DC | PRN

## 2021-07-21 MED ORDER — IPRATROPIUM-ALBUTEROL 0.5-2.5 (3) MG/3ML IN SOLN
3.0000 mL | Freq: Once | RESPIRATORY_TRACT | Status: AC
  Administered 2021-07-21: 3 mL via RESPIRATORY_TRACT
  Filled 2021-07-21: qty 3

## 2021-07-21 MED ORDER — ALBUTEROL SULFATE (2.5 MG/3ML) 0.083% IN NEBU: 3.0000 mL | INHALATION_SOLUTION | RESPIRATORY_TRACT | Status: DC | PRN

## 2021-07-21 MED ORDER — SODIUM CHLORIDE 0.9 % IV SOLN
10.0000 mL/h | Freq: Once | INTRAVENOUS | Status: AC
  Administered 2021-07-21: 10 mL/h via INTRAVENOUS

## 2021-07-21 NOTE — H&P (Signed)
History and Physical    Patient: Robin [name] [id number]:[id number] DOB: [birth year] DOA: 07/21/2021 DOS: the patient was seen and examined on 07/21/2021 PCP: Donnie Coffin, MD  Patient coming from: Home  Chief Complaint:  Chief Complaint  Patient presents with   Shortness of Breath    HPI: Robin [name] is a 71 y.o. female with medical history significant of nicotine dependence, hypertension, diabetes mellitus who presents to the emergency room for evaluation of worsening shortness of breath. Patient states that she is a smoker and is usually short of breath but over the last 2 weeks she has noted worsening of her symptoms associated with bilateral lower extremity swelling and dizziness.  She denies having any chest pain, denies having any orthopnea, no falls or loss of consciousness. She denies having any hematemesis, no hematochezia or passage of melena stools.  She denies NSAID use.  She denies having any changes in her bowel habits, no nausea, no vomiting, no urinary symptoms, no fever, no chills, no cough, no blood pressure no focal deficit. On room air at rest she had pulse oximetry of 90% but with minimal exertion pulse oximetry dropped to 79% and then improved back to the low 90s on 3 L of oxygen.  Review of Systems: As mentioned in the history of present illness. All other systems reviewed and are negative. Past Medical History:  Diagnosis Date   Cellulitis    Diabetes mellitus without complication (HCC)    High cholesterol    Hypertension    History reviewed. No pertinent surgical history. Social History:  reports that she has been smoking cigarettes. She has a 47.00 pack-year smoking history. She has never used smokeless tobacco. She reports current alcohol use. She reports that she does not use drugs.  No Known Allergies  Family History  Problem Relation Age of Onset   Breast cancer Sister 54    Prior to Admission medications   Medication Sig Start Date End Date Taking?  Authorizing Provider  albuterol (PROVENTIL HFA;VENTOLIN HFA) 108 (90 Base) MCG/ACT inhaler Inhale 2 puffs into the lungs every 4 (four) hours as needed for wheezing or shortness of breath. 06/22/16   Frederich Cha, MD  aspirin EC 81 MG tablet Take 81 mg by mouth daily.    [provider]  atorvastatin (LIPITOR) 10 MG tablet Take 10 mg by mouth daily.    [provider]  azithromycin (ZITHROMAX) 500 MG tablet 1 tablet daily for 5 days 06/22/16   Frederich Cha, MD  chlorpheniramine-HYDROcodone [facility] PENNKINETIC ER) 10-8 MG/5ML SUER Take 5 mLs by mouth every 12 (twelve) hours as needed for cough. 06/22/16   Frederich Cha, MD  lisinopril-hydrochlorothiazide (PRINZIDE,ZESTORETIC) 10-12.5 MG tablet Take 1 tablet by mouth daily.    [provider]  predniSONE (STERAPRED UNI-PAK 21 TAB) 10 MG (21) TBPK tablet Sig 6 tablet day 1, 5 tablets day 2, 4 tablets day 3,,3tablets day 4, 2 tablets day 5, 1 tablet day 6 take all tablets orally 06/22/16   Frederich Cha, MD    Physical Exam: Vitals:   07/21/21 1029 07/21/21 1200 07/21/21 1223  BP: (!) 162/69  (!) 113/51  Pulse: (!) 105 90 90  Resp: (!) 21 (!) 27 (!) 28  Temp: 97.9 F (36.6 C)    SpO2: 90% 100% 98%   General: Awake, comfortable and in no distress CV:  Good peripheral perfusion.  Tachycardia Resp:  Mild tachypnea with increased respiratory effort. Abd:No distention.  Soft, nontender.  No rebound or  guarding. Other: Significant 3+ lower extremity edema bilaterally.  Data Reviewed: Labs reviewed and patient noted to have hemoglobin of 6.3 compared to 11.8, 7 years ago.  She also has microcytosis. BNP is elevated at 790 Respiratory panel reviewed and negative Chest x-ray reviewed and shows cardiomegaly There are no new results to review at this time.  Assessment and Plan: No notes have been filed under this hospital service. Service: Hospitalist  Principal Problem:   Symptomatic anemia Active Problems:   Acute  respiratory failure (HCC)   Hypertension   Obesity   Heart failure (HCC)   Nicotine dependence   Symptomatic microcytic anemia Patient presents for evaluation of worsening shortness of breath from her baseline and has bilateral lower extremity swelling. Noted to have a hemoglobin of 6.3g/dl compared to baseline of 11.8g/dl, over 7 years ago. She has microcytosis as well but denies having any hematochezia, no melena stools or hematemesis. Obtain iron panel Transfuse 2 units of packed RBC We will likely need referral to GI as an outpatient for upper and lower endoscopy.     Acute CHF Most likely high-output failure from anemia vs from hypertensive heart disease Patient noted to have cardiomegaly on chest x-ray Obtain 2D echocardiogram to assess LVEF Continue diuretic therapy    Nicotine dependence Smoking cessation has been discussed with patient in detail We will place patient on a nicotine transdermal patch     Obesity Complicates overall prognosis and care     Hypertension Hold lisinopril/HCTZ for now since patient is normotensive.   Advance Care Planning:   Code Status: Full Code   Consults: None  Family Communication: Greater than 50% of time was spent discussing patient's condition and plan of care with her at the bedside.  All questions and concerns have been addressed.  She verbalizes understanding and agrees with the plan.  Severity of Illness: The appropriate patient status for this patient is OBSERVATION. Observation status is judged to be reasonable and necessary in order to provide the required intensity of service to ensure the patient's safety. The patient's presenting symptoms, physical exam findings, and initial radiographic and laboratory data in the context of their medical condition is felt to place them at decreased risk for further clinical deterioration. Furthermore, it is anticipated that the patient will be medically stable for discharge from  the hospital within 2 midnights of admission.   Author: Collier Bullock, MD 07/21/2021 1:27 PM  For on call review [email or web address].

## 2021-07-21 NOTE — ED Provider Notes (Signed)
[facility] Provider Note    Event Date/Time   First MD Initiated Contact with Patient 07/21/21 1034     (approximate)  History   Chief Complaint: Shortness of Breath  HPI  Robin [name] is a 71 y.o. female with a past medical history of diabetes, hypertension, presents to the emergency department for shortness of breath.  According to the patient over the past week she has had progressively worse shortness of breath but acutely worse over the past 2 to 3 days.  Patient states significant shortness of breath with minimal exertion.  She is also noticed for the past 2 weeks she has had significant lower extremity edema.  Denies any chest pain.  Denies any fever or cough.  Physical Exam   Triage Vital Signs: ED Triage Vitals [07/21/21 1029]  Enc Vitals Group     BP (!) 162/69     Pulse Rate (!) 105     Resp (!) 21     Temp 97.9 F (36.6 C)     Temp src      SpO2 90 %     Weight      Height      Head Circumference      Peak Flow      Pain Score      Pain Loc      Pain Edu?      Excl. in [place]?     Most recent vital signs: Vitals:   07/21/21 1029  BP: (!) 162/69  Pulse: (!) 105  Resp: (!) 21  Temp: 97.9 F (36.6 C)  SpO2: 90%    General: Awake, mild respiratory distress with tachypnea speaking in 1-2 word sentences. CV:  Good peripheral perfusion.  Regular rate and rhythm around 100 bpm Resp:  Mild tachypnea with increased respiratory effort. Abd:  No distention.  Soft, nontender.  No rebound or guarding. Other:  Significant 3+ lower extremity edema bilaterally.   ED Results / Procedures / Treatments   EKG  EKG viewed and interpreted by myself shows sinus tachycardia 125 bpm with a narrow QRS, normal axis, normal intervals besides QTC prolongation, nonspecific but no concerning ST changes.  RADIOLOGY  I personally reviewed the chest x-ray images, no acute abnormality on my evaluation. Radiology is read the chest x-ray is cardiomegaly  without pulmonary edema.   MEDICATIONS ORDERED IN ED: Medications  ipratropium-albuterol (DUONEB) 0.5-2.5 (3) MG/3ML nebulizer solution 3 mL (has no administration in time range)     IMPRESSION / MDM / ASSESSMENT AND PLAN / ED COURSE  I reviewed the triage vital signs and the nursing notes.  Patient presents to the emergency department for worsening shortness of breath.  Patient moved from the chair to the stretcher became very short of breath hypoxic to 75% with a good waveform on room air.  No baseline O2 requirement.  Placed patient on 2 L nasal cannula now satting in the mid 90s.  Patient appears short of breath she is tachypneic with minimal exertion, has slight expiratory wheeze.  States she is prescribed an inhaler for this.  We will dose a DuoNeb.  Patient has significant lower extremity edema which she states is new x2 weeks.  Given the significant edema shortness of breath and hypoxia highly suspect CHF exacerbation.  Differential would also include ACS, pneumonia, pneumothorax, asthma exacerbation.  We will check labs including cardiac enzymes, BNP, COVID/flu.  Chest x-ray does not appear to show any significant pulmonary edema.  Lab work  is pending.  Patient's hemoglobin is 6.3, last hemoglobin is from 7 years ago was 12.  Patient states she had had some dark stool several weeks or months ago but none recently.  Rectal examination shows light brown stool guaiac negative.  Given the patient is low hemoglobin and significant lower extremity edema I suspect high-output heart failure, we will transfuse 2 units of packed red blood cells we will dose IV Lasix and admit the patient to the hospital service for further work-up and treatment.  I consented the patient to the blood product she is agreeable to admission and work-up. CRITICAL CARE Performed by: Harvest Dark   Total critical care time: 30 minutes  Critical care time was exclusive of separately billable procedures and treating  other patients.  Critical care was necessary to treat or prevent imminent or life-threatening deterioration.  Critical care was time spent personally by me on the following activities: development of treatment plan with patient and/or surrogate as well as nursing, discussions with consultants, evaluation of patient's response to treatment, examination of patient, obtaining history from patient or surrogate, ordering and performing treatments and interventions, ordering and review of laboratory studies, ordering and review of radiographic studies, pulse oximetry and re-evaluation of patient's condition.   FINAL CLINICAL IMPRESSION(S) / ED DIAGNOSES   Dyspnea Hypoxia CHF Fluid overload Symptomatic anemia   Note:  This document was prepared using Dragon voice recognition software and may include unintentional dictation errors.   Harvest Dark, MD 07/21/21 1315

## 2021-07-21 NOTE — ED Notes (Signed)
Paper blood consent signed by patient and Paduchowski, MD.

## 2021-07-21 NOTE — ED Notes (Signed)
Pt reports shob for past few days, labored breathing noted. Unable to speak complete sentences. Denies cp. 79% on RA. Placed 3 L [place]. EDP aware.

## 2021-07-21 NOTE — ED Triage Notes (Signed)
Pt c/o increased SOB with BL LE edema for the past 2 weeks,

## 2021-07-22 ENCOUNTER — Inpatient Hospital Stay (HOSPITAL_COMMUNITY)
Admit: 2021-07-22 | Discharge: 2021-07-22 | Disposition: A | Discharge status: Home / Self Care | Payer: Medicare Other | Attending: Internal Medicine | Admitting: Internal Medicine

## 2021-07-22 DIAGNOSIS — I509 Heart failure, unspecified: Secondary | ICD-10-CM

## 2021-07-22 DIAGNOSIS — I110 Hypertensive heart disease with heart failure: Secondary | ICD-10-CM | POA: Diagnosis present

## 2021-07-22 DIAGNOSIS — E785 Hyperlipidemia, unspecified: Secondary | ICD-10-CM

## 2021-07-22 DIAGNOSIS — E7800 Pure hypercholesterolemia, unspecified: Secondary | ICD-10-CM | POA: Diagnosis present

## 2021-07-22 DIAGNOSIS — D509 Iron deficiency anemia, unspecified: Secondary | ICD-10-CM | POA: Diagnosis present

## 2021-07-22 DIAGNOSIS — R0602 Shortness of breath: Secondary | ICD-10-CM | POA: Diagnosis present

## 2021-07-22 DIAGNOSIS — Z6837 Body mass index (BMI) 37.0-37.9, adult: Secondary | ICD-10-CM

## 2021-07-22 DIAGNOSIS — I10 Essential (primary) hypertension: Secondary | ICD-10-CM

## 2021-07-22 DIAGNOSIS — Z20822 Contact with and (suspected) exposure to covid-19: Secondary | ICD-10-CM | POA: Diagnosis present

## 2021-07-22 DIAGNOSIS — Z6841 Body Mass Index (BMI) 40.0 and over, adult: Secondary | ICD-10-CM | POA: Diagnosis not present

## 2021-07-22 DIAGNOSIS — Z7982 Long term (current) use of aspirin: Secondary | ICD-10-CM | POA: Diagnosis not present

## 2021-07-22 DIAGNOSIS — Z803 Family history of malignant neoplasm of breast: Secondary | ICD-10-CM | POA: Diagnosis not present

## 2021-07-22 DIAGNOSIS — F17210 Nicotine dependence, cigarettes, uncomplicated: Secondary | ICD-10-CM | POA: Diagnosis present

## 2021-07-22 DIAGNOSIS — I5031 Acute diastolic (congestive) heart failure: Secondary | ICD-10-CM

## 2021-07-22 DIAGNOSIS — E876 Hypokalemia: Secondary | ICD-10-CM | POA: Diagnosis present

## 2021-07-22 DIAGNOSIS — Z79899 Other long term (current) drug therapy: Secondary | ICD-10-CM | POA: Diagnosis not present

## 2021-07-22 DIAGNOSIS — D649 Anemia, unspecified: Secondary | ICD-10-CM | POA: Diagnosis not present

## 2021-07-22 DIAGNOSIS — I5033 Acute on chronic diastolic (congestive) heart failure: Secondary | ICD-10-CM

## 2021-07-22 DIAGNOSIS — E1169 Type 2 diabetes mellitus with other specified complication: Secondary | ICD-10-CM | POA: Diagnosis present

## 2021-07-22 DIAGNOSIS — J9601 Acute respiratory failure with hypoxia: Secondary | ICD-10-CM | POA: Diagnosis not present

## 2021-07-22 DIAGNOSIS — J9621 Acute and chronic respiratory failure with hypoxia: Secondary | ICD-10-CM | POA: Diagnosis present

## 2021-07-22 LAB — TYPE AND SCREEN
ABO/RH(D): O POS
Antibody Screen: NEGATIVE
Unit division: 0
Unit division: 0

## 2021-07-22 LAB — GLUCOSE, CAPILLARY
Glucose-Capillary: 190 mg/dL — ABNORMAL HIGH (ref 70–99)
Glucose-Capillary: 147 mg/dL — ABNORMAL HIGH (ref 70–99)

## 2021-07-22 LAB — FERRITIN: Ferritin: 8 ng/mL — ABNORMAL LOW (ref 11–307)

## 2021-07-22 LAB — BPAM RBC
Blood Product Expiration Date: 202303072359
Blood Product Expiration Date: 202303072359
ISSUE DATE / TIME: 202302021354
ISSUE DATE / TIME: 202302021744
Unit Type and Rh: 5100
Unit Type and Rh: 5100

## 2021-07-22 LAB — CBC
HCT: 31.9 % — ABNORMAL LOW (ref 36.0–46.0)
Hemoglobin: 9.1 g/dL — ABNORMAL LOW (ref 12.0–15.0)
MCH: 19.5 pg — ABNORMAL LOW (ref 26.0–34.0)
MCHC: 28.5 g/dL — ABNORMAL LOW (ref 30.0–36.0)
MCV: 68.5 fL — ABNORMAL LOW (ref 80.0–100.0)
Platelets: 281 10*3/uL (ref 150–400)
RBC: 4.66 MIL/uL (ref 3.87–5.11)
RDW: 29 % — ABNORMAL HIGH (ref 11.5–15.5)
WBC: 11 10*3/uL — ABNORMAL HIGH (ref 4.0–10.5)
nRBC: 0.4 % — ABNORMAL HIGH (ref 0.0–0.2)

## 2021-07-22 LAB — BASIC METABOLIC PANEL
Anion gap: 7 (ref 5–15)
BUN: 14 mg/dL (ref 8–23)
CO2: 32 mmol/L (ref 22–32)
Calcium: 8.4 mg/dL — ABNORMAL LOW (ref 8.9–10.3)
Chloride: 101 mmol/L (ref 98–111)
Creatinine, Ser: 0.64 mg/dL (ref 0.44–1.00)
GFR, Estimated: >60 mL/min (ref >60)
Glucose, Bld: 149 mg/dL — ABNORMAL HIGH (ref 70–99)
Potassium: 4.4 mmol/L (ref 3.5–5.1)
Sodium: 140 mmol/L (ref 135–145)

## 2021-07-22 LAB — ECHOCARDIOGRAM COMPLETE
AR max vel: 1.08 cm2
AV Area VTI: 1.11 cm2
AV Area mean vel: 0.94 cm2
AV Mean grad: 5 mmHg
AV Peak grad: 10.1 mmHg
Ao pk vel: 1.59 m/s
Area-P 1/2: 4.29 cm2
MV VTI: 1.08 cm2
S' Lateral: 2.67 cm

## 2021-07-22 LAB — HEMOGLOBIN A1C
Hgb A1c MFr Bld: 7.3 % — ABNORMAL HIGH (ref 4.8–5.6)
Mean Plasma Glucose: 162.81 mg/dL

## 2021-07-22 MED ORDER — SODIUM CHLORIDE 0.9 % IV SOLN
400.0000 mg | Freq: Once | INTRAVENOUS | Status: AC
  Administered 2021-07-22: 16:00:00 400 mg via INTRAVENOUS
  Filled 2021-07-22: qty 20

## 2021-07-22 MED ORDER — ENSURE ENLIVE PO LIQD
237.0000 mL | Freq: Two times a day (BID) | ORAL | Status: DC
  Administered 2021-07-22 – 2021-07-24 (×4): 237 mL via ORAL

## 2021-07-22 MED ORDER — ADULT MULTIVITAMIN W/MINERALS CH
1.0000 | ORAL_TABLET | Freq: Every day | ORAL | Status: DC
  Administered 2021-07-22 – 2021-07-24 (×3): 1 via ORAL
  Filled 2021-07-22 (×3): qty 1

## 2021-07-22 MED ORDER — FUROSEMIDE 10 MG/ML IJ SOLN
40.0000 mg | Freq: Two times a day (BID) | INTRAMUSCULAR | Status: DC
  Administered 2021-07-22 – 2021-07-23 (×4): 40 mg via INTRAVENOUS
  Filled 2021-07-22 (×6): qty 4

## 2021-07-22 MED ORDER — INSULIN ASPART 100 UNIT/ML IJ SOLN: 0.0000 [IU] | Freq: Every day | INTRAMUSCULAR | Status: DC

## 2021-07-22 MED ORDER — POTASSIUM CHLORIDE CRYS ER 20 MEQ PO TBCR
20.0000 meq | EXTENDED_RELEASE_TABLET | Freq: Two times a day (BID) | ORAL | Status: DC
  Administered 2021-07-22 – 2021-07-24 (×4): 20 meq via ORAL
  Filled 2021-07-22 (×4): qty 1

## 2021-07-22 MED ORDER — INSULIN ASPART 100 UNIT/ML IJ SOLN
0.0000 [IU] | Freq: Three times a day (TID) | INTRAMUSCULAR | Status: DC
  Administered 2021-07-22: 1 [IU] via SUBCUTANEOUS
  Administered 2021-07-23 (×2): 2 [IU] via SUBCUTANEOUS
  Administered 2021-07-23: 08:00:00 1 [IU] via SUBCUTANEOUS
  Administered 2021-07-24: 4 [IU] via SUBCUTANEOUS
  Filled 2021-07-22 (×5): qty 1

## 2021-07-22 NOTE — Progress Notes (Addendum)
°  Progress Note   Patient: Robin [name] PPJ:[id number] DOB: 15-Mar-1951 DOA: 07/21/2021     0 DOS: the patient was seen and examined on 07/22/2021     Assessment and Plan: * Symptomatic anemia- (present on admission) The patient was transfused 2 units of packed red blood cells on a hemoglobin of 6.3.  Hemoglobin did come up to 9.1.  Since the patient is iron deficient with a ferritin of 8, I will give IV Venofer today.  Consulted GI for further work-up.  Acute on chronic diastolic CHF (congestive heart failure) (HCC) Will increase IV Lasix to 40 mg IV twice daily and give potassium supplementation.  Will need another day at least of IV diuresis.  Type 2 diabetes mellitus with hyperlipidemia (HCC) Continue atorvastatin.  Hold metformin.  Placed on sliding scale insulin.  Obesity- (present on admission) BMI 37.11.  Watch closely with diuresis.  Essential hypertension Holding lisinopril currently.  Continue Lasix and monitor blood pressure.        Subjective: Patient feels better than when she came in.  Breathing a little bit better.  Admitted with symptomatic anemia.  Physical Exam: Vitals:   07/21/21 2040 07/22/21 0520 07/22/21 0737 07/22/21 1122  BP: 123/84 (!) 119/57 128/75 137/72  Pulse: 76 88 84 90  Resp: 16 20 (!) 22 (!) 21  Temp: 98.1 F (36.7 C) 98.3 F (36.8 C) 97.7 F (36.5 C) 97.9 F (36.6 C)  TempSrc: Oral Oral Oral Oral  SpO2: 95% 95% 100% 96%   Physical Exam HENT:     Head: Normocephalic.     Mouth/Throat:     Pharynx: No oropharyngeal exudate.  Eyes:     General: Lids are normal.     Conjunctiva/sclera: Conjunctivae normal.  Cardiovascular:     Rate and Rhythm: Normal rate and regular rhythm.     Heart sounds: Normal heart sounds, S1 normal and S2 normal.  Pulmonary:     Breath sounds: Examination of the right-lower field reveals decreased breath sounds and rales. Examination of the left-lower field reveals decreased breath sounds and rales. Decreased  breath sounds and rales present. No wheezing or rhonchi.  Abdominal:     Palpations: Abdomen is soft.     Tenderness: There is no abdominal tenderness.  Musculoskeletal:     Right lower leg: No swelling.     Left lower leg: No swelling.  Skin:    General: Skin is warm.     Findings: No rash.  Neurological:     Mental Status: She is alert and oriented to person, place, and time.     Data Reviewed: Laboratory and radiological data reviewed.  Hemoglobin came up to 9.1 today.  Ferritin very low at 8.  Echocardiogram showed a normal EF  Disposition: Status is: Inpatient Remains inpatient appropriate because: Requiring IV Lasix today.  We will also give IV iron today.  GI evaluated and patient will do outpatient procedures.  Planned Discharge Destination: Home  Author: Loletha Grayer, MD 07/22/2021 2:24 PM  For on call review [email or web address].

## 2021-07-22 NOTE — Progress Notes (Signed)
*  PRELIMINARY RESULTS* Echocardiogram 2D Echocardiogram has been performed.  Robin [name] 07/22/2021, 11:56 AM

## 2021-07-22 NOTE — Assessment & Plan Note (Addendum)
The patient was diuresed with Lasix 40 mg IV twice daily during the hospital course.  Patient will be prescribed Lasix 40 mg orally twice a day upon disposition.  I added Toprol-XL low-dose at night.  Follow-up with the CHF clinic.

## 2021-07-22 NOTE — Assessment & Plan Note (Addendum)
The patient received 2 units of packed red blood cells during the hospital course and IV Venofer.  Patient seen in consultation by Dr. Alice Reichert and will follow-up as outpatient for endoscopy and colonoscopy for iron deficiency anemia.

## 2021-07-22 NOTE — Progress Notes (Signed)
Initial Nutrition Assessment  DOCUMENTATION CODES:  Obesity unspecified  INTERVENTION:  Advance diet as medically able and as tolerated.  Add Ensure Plus High Protein po BID, each supplement provides 350 kcal and 20 grams of protein.   Add MVI with minerals daily.  Encourage PO and supplement intake.  Obtain daily weights.  NUTRITION DIAGNOSIS:  Increased nutrient needs related to acute illness as evidenced by estimated needs.  GOAL:  Patient will meet greater than or equal to 90% of their needs  MONITOR:  Diet advancement, PO intake, Supplement acceptance, Labs, Weight trends, I & O's  REASON FOR ASSESSMENT:  Malnutrition Screening Tool    ASSESSMENT:  71 yo female with a PMH of HTN, T2DM, tobacco abuse, cellulitis, and high cholesterol who is admitted with symptomatic anemia.  Plan for EGD tomorrow. FLD today per GI.  Pt on commode. RD to follow-up at a later date. History obtained from chart review.  Per Epic, pt ate 100% of a FLD breakfast and lunch today.  Pt with no admission weight. RD to order daily weights to monitor while diuresing occurs.  Of note, pt with mild BLE edema.  Medications: reviewed; Lasix BID, SSI, Klor-Con 20 mEq, iron sucrose once per IV  Labs: reviewed; Glucose 149 (H)  NUTRITION - FOCUSED PHYSICAL EXAM: Unable to perform - defer to follow-up  Diet Order:   Diet Order             Diet full liquid Room service appropriate? Yes; Fluid consistency: Thin  Diet effective now                  EDUCATION NEEDS:  Education needs have been addressed  Skin:  Skin Assessment: Reviewed RN Assessment  Last BM:  07/21/21  Height:  Ht Readings from Last 1 Encounters:  10/07/20 5' (1.524 m)   Weight:  Wt Readings from Last 1 Encounters:  10/07/20 86.2 kg   BMI:  There is no height or weight on file to calculate BMI.  Estimated Nutritional Needs:  Kcal:  1900-2100 Protein:  105-120 grams Fluid:  2 L  Derrel Nip, RD, LDN  (she/her/hers) Clinical Inpatient Dietitian RD Pager/After-Hours/Weekend Pager # in [place]

## 2021-07-22 NOTE — Clinical Social Work Note (Signed)
°  Transition of Care (TOC) Screening Note   Patient Details  Name: [name] Date of Birth: 1950-11-25   Transition of Care [facility]) CM/SW Contact:    Eileen Stanford, LCSW Phone [place] 07/22/2021, 4:01 PM    Transition of Care Department [facility]) has reviewed patient and no TOC needs have been identified at this time. We will continue to monitor patient advancement through interdisciplinary progression rounds. If new patient transition needs arise, please place a TOC consult.

## 2021-07-22 NOTE — Assessment & Plan Note (Addendum)
Holding lisinopril currently.  Added Toprol-XL and Lasix.

## 2021-07-22 NOTE — Assessment & Plan Note (Deleted)
BMI 37.11.  Watch closely with diuresis.  Asking for another weight today.

## 2021-07-22 NOTE — Consult Note (Signed)
GI Inpatient Consult Note  Reason for Consult: Symptomatic anemia   Attending Requesting Consult: Dr. Loletha Grayer, MD  History of Present Illness: Robin [name] is a 71 y.o. female seen for evaluation of symptomatic anemia at the request of hospitalist - Dr. Loletha Grayer. Pt has a PMH of HTN, HLD, diabetes mellitus, obesity, and nicotine dependence. She presented to the [facility] ED yesterday morning for chief complaint of progressive shortness of breath and bilateral lower extremity edema ongoing over the past two weeks. Upon presentation to the ED, labs were significant for severe microcytic anemia with hemoglobin 6.3 and MCV 61. No recent hemoglobin available for review, last hemoglobin was 12 >7 years ago. On room air at rest she had pulse oximetry of 90% but with minimal exertion pulse oximetry dropped to 79% and then improved back to the low 90s on 3L of oxygen. Per ED physician, DRE showed light brown stool in rectal vault which was guaiac negative. She was transfused 2 units pRBCs with improvement in hemoglobin to 9.1 this morning. She was also found to have iron-deficiency anemia with ferritin 8, iron sat 3%, iron 13, and TIBC 518. She was diuresed with IV Lasix. GI consulted for further evaluation and management of symptomatic anemia.   Patient seen and examined this afternoon resting comfortably in hospital bed. She denies any acute overnight events. She reports she feels a lot better than she did yesterday. She reports "I feel 100% better." She denies any recent changes in her bowel habits. She denies any overt hematochezia or melanotic stools. She denies any abdominal pain or abdominal cramping. She denies any UGI symptoms such as nausea, vomiting, heartburn, reflux, esophageal dysphagia, odynophagia, early satiety, hoarseness, or epigastric abdominal pain. She denies any loss of appetite or unintentional weight loss. She reports she can use Aleve couple times a month for headaches or  mild musculoskeletal pains. She has never had an endoscopy or colonoscopy. She lives at home with her mother and daughter.    Past Medical History:  Past Medical History:  Diagnosis Date   Cellulitis    Diabetes mellitus without complication (HCC)    High cholesterol    Hypertension     Problem List: Patient Active Problem List   Diagnosis Date Noted   CHF (congestive heart failure) ([place]) 07/22/2021   Symptomatic anemia 07/21/2021   Acute respiratory failure ([place]) 07/21/2021   Hypertension    Obesity    Heart failure (HCC)    Nicotine dependence     Past Surgical History: History reviewed. No pertinent surgical history.  Allergies: No Known Allergies  Home Medications: Medications Prior to Admission  Medication Sig Dispense Refill Last Dose   albuterol (PROVENTIL HFA;VENTOLIN HFA) 108 (90 Base) MCG/ACT inhaler Inhale 2 puffs into the lungs every 4 (four) hours as needed for wheezing or shortness of breath. 1 Inhaler 1 07/21/2021   atorvastatin (LIPITOR) 20 MG tablet Take 20 mg by mouth daily.   07/19/2021 at 0800   lisinopril (ZESTRIL) 10 MG tablet Take 10 mg by mouth daily.   07/19/2021 at 0800   aspirin EC 81 MG tablet Take 81 mg by mouth daily.   07/19/2021 at 0800   metFORMIN (GLUCOPHAGE-XR) 500 MG 24 hr tablet Take 500 mg by mouth 2 (two) times daily.   07/19/2021 at 0800   Home medication reconciliation was completed with the patient.   Scheduled Inpatient Medications:    atorvastatin  20 mg Oral QPM   furosemide  40 mg  Intravenous BID   nicotine  14 mg Transdermal Daily   sodium chloride flush  3 mL Intravenous Q12H    Continuous Inpatient Infusions:    sodium chloride     iron sucrose      PRN Inpatient Medications:  sodium chloride, acetaminophen **OR** acetaminophen, albuterol, ondansetron **OR** ondansetron (ZOFRAN) IV, sodium chloride flush  Family History: family history includes Breast cancer (age of onset: 88) in her sister.  The patient's family  history is negative for inflammatory bowel disorders, GI malignancy, or solid organ transplantation.  Social History:   reports that she has been smoking cigarettes. She has a 47.00 pack-year smoking history. She has never used smokeless tobacco. She reports current alcohol use. She reports that she does not use drugs. The patient denies ETOH, tobacco, or drug use.   Review of Systems: Constitutional: Weight is stable.  Eyes: No changes in vision. ENT: No oral lesions, sore throat.  GI: see HPI.  Heme/Lymph: No easy bruising.  CV: No chest pain.  GU: No hematuria.  Integumentary: No rashes.  Neuro: No headaches.  Psych: No depression/anxiety.  Endocrine: No heat/cold intolerance.  Allergic/Immunologic: No urticaria.  Resp: +SOBOE Musculoskeletal: No joint swelling.    Physical Examination: BP 137/72 (BP Location: Right Arm)    Pulse 90    Temp 97.9 F (36.6 C) (Oral)    Resp (!) 21    SpO2 96%  Non-toxic appearing female in exam bed. No accessory muscle use.  Gen: NAD, alert and oriented x 4 HEENT: PEERLA, EOMI, Neck: supple, no JVD or thyromegaly Chest: No increased respiratory effort, bibasilar rales CV: RRR, no m/g/c/r Abd: soft, NT, ND, +BS in all four quadrants; no HSM, guarding, ridigity, or rebound tenderness Ext: no edema, well perfused with 2+ pulses, Skin: no rash or lesions noted Lymph: no LAD  Data: Lab Results  Component Value Date   WBC 11.0 (H) 07/22/2021   HGB 9.1 (L) 07/22/2021   HCT 31.9 (L) 07/22/2021   MCV 68.5 (L) 07/22/2021   PLT 281 07/22/2021   Recent Labs  Lab 07/21/21 1052 07/22/21 0803  HGB 6.3* 9.1*   Lab Results  Component Value Date   NA 140 07/22/2021   K 4.4 07/22/2021   CL 101 07/22/2021   CO2 32 07/22/2021   BUN 14 07/22/2021   CREATININE 0.64 07/22/2021   Lab Results  Component Value Date   ALT 86 (H) 07/21/2021   AST 74 (H) 07/21/2021   ALKPHOS 95 07/21/2021   BILITOT 1.0 07/21/2021   No results for input(s): APTT,  INR, PTT in the last 168 hours.  Assessment/Plan:  71 y/o female with a PMH of HTN, diabetes mellitus, and nicotine dependence presented to the [facility] ED yesterday morning for chief complaint of shortness of breath and bilateral lower extremity edema x 2 weeks found to have symptomatic anemia with severe iron-deficiency  Symptomatic anemia/Iron-deficiency anemia - severe microcytic anemia upon presentation to the ED with hemoglobin 6.3 and MCV 61 with ferritin 8, iron sat 3%, and TIBC >500. No evidence of overt gastrointestinal blood loss - DRE with brown stool which was guaiac negative. S/p 2 units pRBCs with increase in hemoglobin to 9.1. DDx includes occult GI malignancy, peptic ulcer disease, gastritis, colon polyp, AVM, duodenitis, erosive esophagitis, malabsorption syndromes, intravascular hemolysis, or nutritional deficiencies.   Acute on chronic dCHF - s/p IV diuresis, s/p echo today  T2DM  Obesity  Recommendations:  - Continue to monitor H&H closely. Transfuse for Hgb <7.0.  -  Continue to monitor for signs of gastrointestinal bleeding. No overt GI bleeding.  - Agree with IV iron infusion today - Discussed rationale and indications of bidirectional endoscopy for work-up of IDA to exclude GI malignancy or chronic GI blood loss. We reviewed that procedures are technically not emergent and can be done as inpatient here at the hospital or done on an elective basis as an outpatient. She greatly prefers for procedures to be done electively as an outpatient and this is reasonable.  - She should go home on oral iron therapy - Discussed plan of care with Dr. Leslye Peer who is in agreement - I will arrange outpatient GI follow-up with our clinic to discuss elective EGD/CSY - GI will sign off now - Regular diet okay   Thank you for the consult. Please call with questions or concerns.  Reeves Forth [facility] [phone] (Cell)

## 2021-07-22 NOTE — Assessment & Plan Note (Addendum)
Continue atorvastatin.  Restart metformin as outpatient.

## 2021-07-23 DIAGNOSIS — J9601 Acute respiratory failure with hypoxia: Secondary | ICD-10-CM

## 2021-07-23 LAB — CBC
HCT: 34.3 % — ABNORMAL LOW (ref 36.0–46.0)
Hemoglobin: 9.4 g/dL — ABNORMAL LOW (ref 12.0–15.0)
MCH: 18.8 pg — ABNORMAL LOW (ref 26.0–34.0)
MCHC: 27.4 g/dL — ABNORMAL LOW (ref 30.0–36.0)
MCV: 68.6 fL — ABNORMAL LOW (ref 80.0–100.0)
Platelets: 281 10*3/uL (ref 150–400)
RBC: 5 MIL/uL (ref 3.87–5.11)
RDW: 30.4 % — ABNORMAL HIGH (ref 11.5–15.5)
WBC: 14 10*3/uL — ABNORMAL HIGH (ref 4.0–10.5)
nRBC: 0.8 % — ABNORMAL HIGH (ref 0.0–0.2)

## 2021-07-23 LAB — BASIC METABOLIC PANEL
Anion gap: 9 (ref 5–15)
BUN: 14 mg/dL (ref 8–23)
CO2: 34 mmol/L — ABNORMAL HIGH (ref 22–32)
Calcium: 8.3 mg/dL — ABNORMAL LOW (ref 8.9–10.3)
Chloride: 97 mmol/L — ABNORMAL LOW (ref 98–111)
Creatinine, Ser: 0.92 mg/dL (ref 0.44–1.00)
GFR, Estimated: >60 mL/min (ref >60)
Glucose, Bld: 198 mg/dL — ABNORMAL HIGH (ref 70–99)
Potassium: 3.8 mmol/L (ref 3.5–5.1)
Sodium: 140 mmol/L (ref 135–145)

## 2021-07-23 LAB — GLUCOSE, CAPILLARY
Glucose-Capillary: 181 mg/dL — ABNORMAL HIGH (ref 70–99)
Glucose-Capillary: 213 mg/dL — ABNORMAL HIGH (ref 70–99)
Glucose-Capillary: 221 mg/dL — ABNORMAL HIGH (ref 70–99)
Glucose-Capillary: 118 mg/dL — ABNORMAL HIGH (ref 70–99)

## 2021-07-23 LAB — MAGNESIUM: Magnesium: 1.5 mg/dL — ABNORMAL LOW (ref 1.7–2.4)

## 2021-07-23 MED ORDER — IPRATROPIUM-ALBUTEROL 0.5-2.5 (3) MG/3ML IN SOLN
3.0000 mL | Freq: Four times a day (QID) | RESPIRATORY_TRACT | Status: DC
  Administered 2021-07-23 – 2021-07-24 (×5): 3 mL via RESPIRATORY_TRACT
  Filled 2021-07-23 (×5): qty 3

## 2021-07-23 MED ORDER — MAGNESIUM SULFATE 2 GM/50ML IV SOLN
2.0000 g | Freq: Once | INTRAVENOUS | Status: AC
  Administered 2021-07-23: 2 g via INTRAVENOUS
  Filled 2021-07-23: qty 50

## 2021-07-23 NOTE — Progress Notes (Signed)
°  Progress Note   Patient: Robin [name] DOB: 03-20-1951 DOA: 07/21/2021     1 DOS: the patient was seen and examined on 07/23/2021     Assessment and Plan: * Acute respiratory failure ([place])- (present on admission) Patient had a pulse ox of 73% on room air today.  Continue diuresis to see if we can get her off oxygen.  I also added nebulizer treatments.  Acute on chronic diastolic CHF (congestive heart failure) (HCC) Continue IV Lasix to 40 mg IV twice daily and give potassium supplementation.  Trying to get off oxygen prior to disposition.  Symptomatic anemia- (present on admission) Hemoglobin stable at 9.4.  Received 2 units of packed red blood cells on a hemoglobin of 6.3.  Received IV Venofer yesterday.  Type 2 diabetes mellitus with hyperlipidemia (HCC) Continue atorvastatin.  Hold metformin.  Placed on sliding scale insulin.  Obesity- (present on admission) BMI 37.11.  Watch closely with diuresis.  Asking for another weight today.  Essential hypertension Holding lisinopril currently.  Continue Lasix and monitor blood pressure.        Subjective: Patient feeling better than when she came in.  Pulse ox of 73% on room air today.  Patient hoping to quit smoking.  Initially admitted with symptomatic anemia.  Physical Exam: Vitals:   07/23/21 0512 07/23/21 0725 07/23/21 0731 07/23/21 1206  BP: 135/62  (!) 118/59 126/62  Pulse: (!) 105  89 83  Resp: 19  (!) 22 20  Temp: (!) 97.5 F (36.4 C)  98.4 F (36.9 C) 98.1 F (36.7 C)  TempSrc: Oral   Oral  SpO2: (!) 73% (!) 78% 95% 95%   Physical Exam HENT:     Head: Normocephalic.     Mouth/Throat:     Pharynx: No oropharyngeal exudate.  Eyes:     General: Lids are normal.     Conjunctiva/sclera: Conjunctivae normal.  Cardiovascular:     Rate and Rhythm: Normal rate and regular rhythm.     Heart sounds: Normal heart sounds, S1 normal and S2 normal.  Pulmonary:     Breath sounds: Examination of the  right-lower field reveals decreased breath sounds and rales. Examination of the left-lower field reveals decreased breath sounds and rales. Decreased breath sounds and rales present. No wheezing or rhonchi.  Abdominal:     Palpations: Abdomen is soft.     Tenderness: There is no abdominal tenderness.  Musculoskeletal:     Right lower leg: No swelling.     Left lower leg: No swelling.  Skin:    General: Skin is warm.     Findings: No rash.  Neurological:     Mental Status: She is alert and oriented to person, place, and time.     Data Reviewed: Pulse ox of 73% on room air, hemoglobin of 9.4.  Watching creatinine closely went up to 0.92.  CO2 also went up to 34.   Disposition: Status is: Inpatient Remains inpatient appropriate because: Pulse ox of 73% on room air.  Trying to see if we can get off oxygen prior to disposition.   Planned Discharge Destination: Home  Author: Loletha Grayer, MD 07/23/2021 12:14 PM  For on call review [email or web address].

## 2021-07-23 NOTE — Assessment & Plan Note (Deleted)
Patient had a pulse ox of 73% on room air today.  Continue diuresis to see if we can get her off oxygen.  I also added nebulizer treatments.

## 2021-07-24 DIAGNOSIS — J9621 Acute and chronic respiratory failure with hypoxia: Secondary | ICD-10-CM

## 2021-07-24 DIAGNOSIS — E876 Hypokalemia: Secondary | ICD-10-CM

## 2021-07-24 LAB — GLUCOSE, CAPILLARY
Glucose-Capillary: 147 mg/dL — ABNORMAL HIGH (ref 70–99)
Glucose-Capillary: 332 mg/dL — ABNORMAL HIGH (ref 70–99)

## 2021-07-24 LAB — BASIC METABOLIC PANEL
Anion gap: 10 (ref 5–15)
BUN: 14 mg/dL (ref 8–23)
CO2: 34 mmol/L — ABNORMAL HIGH (ref 22–32)
Calcium: 8.2 mg/dL — ABNORMAL LOW (ref 8.9–10.3)
Chloride: 94 mmol/L — ABNORMAL LOW (ref 98–111)
Creatinine, Ser: 0.8 mg/dL (ref 0.44–1.00)
GFR, Estimated: >60 mL/min (ref >60)
Glucose, Bld: 157 mg/dL — ABNORMAL HIGH (ref 70–99)
Potassium: 3.8 mmol/L (ref 3.5–5.1)
Sodium: 138 mmol/L (ref 135–145)

## 2021-07-24 LAB — MAGNESIUM: Magnesium: 1.6 mg/dL — ABNORMAL LOW (ref 1.7–2.4)

## 2021-07-24 MED ORDER — ENSURE ENLIVE PO LIQD: 237.0000 mL | Freq: Two times a day (BID) | ORAL | 0 refills | Status: AC

## 2021-07-24 MED ORDER — MAGNESIUM OXIDE -MG SUPPLEMENT 400 (240 MG) MG PO TABS: 400.0000 mg | ORAL_TABLET | Freq: Two times a day (BID) | ORAL | 0 refills | Status: DC

## 2021-07-24 MED ORDER — FUROSEMIDE 40 MG PO TABS: 40.0000 mg | ORAL_TABLET | Freq: Two times a day (BID) | ORAL | 0 refills | Status: DC

## 2021-07-24 MED ORDER — POTASSIUM CHLORIDE CRYS ER 20 MEQ PO TBCR: 20.0000 meq | EXTENDED_RELEASE_TABLET | Freq: Two times a day (BID) | ORAL | 0 refills | Status: DC

## 2021-07-24 MED ORDER — FUROSEMIDE 40 MG PO TABS
40.0000 mg | ORAL_TABLET | Freq: Two times a day (BID) | ORAL | Status: DC
  Administered 2021-07-24: 40 mg via ORAL
  Filled 2021-07-24: qty 1

## 2021-07-24 MED ORDER — MAGNESIUM OXIDE -MG SUPPLEMENT 400 (240 MG) MG PO TABS
400.0000 mg | ORAL_TABLET | Freq: Two times a day (BID) | ORAL | Status: DC
  Administered 2021-07-24: 10:00:00 400 mg via ORAL
  Filled 2021-07-24: qty 1

## 2021-07-24 MED ORDER — MOMETASONE FURO-FORMOTEROL FUM 100-5 MCG/ACT IN AERO: 2.0000 | INHALATION_SPRAY | Freq: Two times a day (BID) | RESPIRATORY_TRACT | 0 refills | Status: DC

## 2021-07-24 MED ORDER — METOPROLOL SUCCINATE ER 25 MG PO TB24: 12.5000 mg | ORAL_TABLET | Freq: Every day | ORAL | 0 refills | Status: DC

## 2021-07-24 NOTE — Assessment & Plan Note (Signed)
Magnesium replaced IV during the hospital course and orally upon disposition.

## 2021-07-24 NOTE — TOC Transition Note (Signed)
Transition of Care [facility]) - CM/SW Discharge Note   Patient Details  Name: Robin [name] MRN: [id number] Date of Birth: 05-18-1951  [facility] CM/SW Contact:  Harriet Masson, RN Phone [phone] 07/24/2021, 10:09 AM   Clinical Narrative:    Recommended home O2. Spoke with pt today concerning preference ([employer]). Referral called to [employer] [facility]) for portable to be delivered to the pt's bedside and concentrator to address on file Pt lives with her daughter and mother with very good support system. Pt able to afford all her medications at her local pharmacy and continues to drive to all her medical appointments and only uses a cane when ambulating. No other needs at this time.  TOC will remain available to further assist with discharge needs.  Final next level of care: Home/Self Care Barriers to Discharge: Barriers Resolved   Patient Goals and CMS Choice     Choice offered to / list presented to : Patient  Discharge Placement                  Name of family member notified: Spoke with pt directly Patient and family notified of of transfer: 07/24/21  Discharge Plan and Services                DME Arranged: Oxygen DME Agency: [employer] Date DME Agency Contacted: 07/24/21 Time DME Agency Contacted: [id number] Representative spoke with at DME Agency: [place] ([place]) Interventions     Readmission Risk Interventions No flowsheet data found.

## 2021-07-24 NOTE — Progress Notes (Signed)
Patient is being discharged home.  Discharge papers given and explained to patient.  She verbalized understanding.  Meds and f/u appointments reviewed.  Rx sent electronically to the pharmacy.  Patient made aware.  Awaiting O2 tank.

## 2021-07-24 NOTE — Assessment & Plan Note (Addendum)
We were unable to get the patient off oxygen during the hospital course.  Pulse ox on room air at rest 79%.  On 2 L of oxygen with ambulating pulse ox held at 92%.  The patient was prescribed oxygen 2 L nasal cannula 24/7 upon disposition.  Added inhalers to see if that improves her air entry.

## 2021-07-24 NOTE — Discharge Summary (Signed)
Physician Discharge Summary   Patient: Robin [name] MRN: [id number] DOB: [birth year]  Admit date:     07/21/2021  Discharge date: 07/24/21  Discharge Physician: Loletha Grayer   PCP: Donnie Coffin, MD   Recommendations at discharge:   Follow-up PMD 5 days Follow-up gastroenterology 2 weeks.  Patient will need outpatient EGD and colonoscopy  Discharge Diagnoses: Principal Problem:   Acute on chronic respiratory failure with hypoxia (HCC) Active Problems:   Acute on chronic diastolic CHF (congestive heart failure) (HCC)   Symptomatic anemia   Essential hypertension   Heart failure (HCC)   Nicotine dependence   Type 2 diabetes mellitus with hyperlipidemia (HCC)   Obesity, Class III, BMI 40-49.9 (morbid obesity) ([place])   Hypokalemia   Hypomagnesemia    Hospital Course: The patient was admitted with symptomatic anemia.  She was transfused 2 units of packed red blood cells and given IV Venofer during the hospital course.  The patient had a ferritin level of 8.  Initial hemoglobin was 6.3.  Hemoglobin 9.4 upon discharge.  The patient was diuresed during the hospital course with IV Lasix throughout the entire hospital stay.  Echocardiogram showed a normal EF of 60 to 65%.  The patient still had lower extremity edema.  BNP on admission 790.4.  The patient was interested in discharge today.  I advised that she can follow-up at [facility] medical supply store to get measured for compression stockings.  We were unfortunately unable to get her off the oxygen.  Pulse ox on 2 L of oxygen 92% with ambulation.  Oxygen saturation at rest 79%  Assessment and Plan: * Acute on chronic respiratory failure with hypoxia (HCC) We were unable to get the patient off oxygen during the hospital course.  Pulse ox on room air at rest 79%.  On 2 L of oxygen with ambulating pulse ox held at 92%.  The patient was prescribed oxygen 2 L nasal cannula 24/7 upon disposition.  Added inhalers to see if that improves her  air entry.  Acute on chronic diastolic CHF (congestive heart failure) ([place]) The patient was diuresed with Lasix 40 mg IV twice daily during the hospital course.  Patient will be prescribed Lasix 40 mg orally twice a day upon disposition.  I added Toprol-XL low-dose at night.  Follow-up with the CHF clinic.  Symptomatic anemia- (present on admission) The patient received 2 units of packed red blood cells during the hospital course and IV Venofer.  Patient seen in consultation by Dr. Alice Reichert and will follow-up as outpatient for endoscopy and colonoscopy for iron deficiency anemia.  Hypomagnesemia Magnesium replaced IV during the hospital course and orally upon disposition.  Hypokalemia Potassium replacement prescribed during the hospital course and upon discharge  Obesity, Class III, BMI 40-49.9 (morbid obesity) (HCC) BMI 42.58.  To monitor as outpatient.  Type 2 diabetes mellitus with hyperlipidemia (HCC) Continue atorvastatin.  Restart metformin as outpatient.   Essential hypertension Holding lisinopril currently.  Added Toprol-XL and Lasix.           Consultants: None Procedures performed: None Disposition: Home Diet recommendation:  Cardiac and Carb modified diet  DISCHARGE MEDICATION: Allergies as of 07/24/2021   No Known Allergies      Medication List     STOP taking these medications    aspirin EC 81 MG tablet   lisinopril 10 MG tablet Commonly known as: ZESTRIL       TAKE these medications    albuterol 108 (90 Base) MCG/ACT inhaler Commonly  known as: VENTOLIN HFA Inhale 2 puffs into the lungs every 4 (four) hours as needed for wheezing or shortness of breath.   atorvastatin 20 MG tablet Commonly known as: LIPITOR Take 20 mg by mouth daily.   feeding supplement Liqd Take 237 mLs by mouth 2 (two) times daily between meals.   furosemide 40 MG tablet Commonly known as: LASIX Take 1 tablet (40 mg total) by mouth 2 (two) times daily.   magnesium  oxide 400 (240 Mg) MG tablet Commonly known as: MAG-OX Take 1 tablet (400 mg total) by mouth 2 (two) times daily.   metFORMIN 500 MG 24 hr tablet Commonly known as: GLUCOPHAGE-XR Take 500 mg by mouth 2 (two) times daily.   metoprolol succinate 25 MG 24 hr tablet Commonly known as: Toprol XL Take 0.5 tablets (12.5 mg total) by mouth at bedtime.   mometasone-formoterol 100-5 MCG/ACT Aero Commonly known as: DULERA Inhale 2 puffs into the lungs 2 (two) times daily.   potassium chloride SA 20 MEQ tablet Commonly known as: KLOR-CON M Take 1 tablet (20 mEq total) by mouth 2 (two) times daily.               Durable Medical Equipment  (From admission, onward)           Start     Ordered   07/24/21 1005  For home use only DME oxygen  Once       Question Answer Comment  Length of Need Lifetime   Mode or (Route) Nasal cannula   Liters per Minute 2   Frequency Continuous (stationary and portable oxygen unit needed)   Oxygen conserving device Yes   Oxygen delivery system Gas      07/24/21 1004            Follow-up Information     Aycock, Ngwe A, MD Follow up in 5 day(s).   Specialty: Family Medicine Contact information: [place] [address] [phone]         Efrain Sella, MD Follow up in 2 week(s).   Specialty: Gastroenterology Contact information: [place] [address] [phone]                 Discharge Exam: Filed Weights   07/24/21 0442  Weight: 98.9 kg   Physical Exam HENT:     Head: Normocephalic.     Mouth/Throat:     Pharynx: No oropharyngeal exudate.  Eyes:     General: Lids are normal.     Conjunctiva/sclera: Conjunctivae normal.  Cardiovascular:     Rate and Rhythm: Normal rate and regular rhythm.     Heart sounds: Normal heart sounds, S1 normal and S2 normal.  Pulmonary:     Breath sounds: Examination of the right-middle field reveals decreased breath sounds.  Examination of the left-middle field reveals decreased breath sounds. Examination of the right-lower field reveals decreased breath sounds. Examination of the left-lower field reveals decreased breath sounds. Decreased breath sounds present. No wheezing, rhonchi or rales.  Abdominal:     Palpations: Abdomen is soft.     Tenderness: There is no abdominal tenderness.  Musculoskeletal:     Right lower leg: Swelling present.     Left lower leg: Swelling present.  Skin:    General: Skin is warm.     Findings: No rash.  Neurological:     Mental Status: She is alert and oriented to person, place, and time.  Condition at discharge: stable  The results of significant diagnostics from this hospitalization (including imaging, microbiology, ancillary and laboratory) are listed below for reference.   Imaging Studies: DG Chest 2 View  Result Date: 07/21/2021 CLINICAL DATA:  Shortness of breath EXAM: CHEST - 2 VIEW COMPARISON:  06/22/2016 FINDINGS: Transverse diameter of heart is increased. There are no signs of pulmonary edema or focal pulmonary consolidation. There is questionable minimal blunting of right lateral CP angle with no significant change. There is no pneumothorax. IMPRESSION: Cardiomegaly. There are no signs of pulmonary edema or focal pulmonary consolidation. Electronically Signed   By: Elmer Picker M.D.   On: 07/21/2021 10:47   ECHOCARDIOGRAM COMPLETE  Result Date: 07/22/2021    ECHOCARDIOGRAM REPORT   Patient Name:   Rinnah A Empie Date of Exam: 07/22/2021 Medical Rec #:  [id number]    Height:       60.0 in Accession #:    [id number]   Weight:       190.0 lb Date of Birth:  05-07-1951    BSA:          1.826 m Patient Age:    35 years     BP:           128/75 mmHg Patient Gender: F            HR:           91 bpm. Exam Location:  [facility] Procedure: 2D Echo, Color Doppler and Cardiac Doppler Indications:     I50.31 congestive heart failure-Acute Diastolic  History:         Patient has no  prior history of Echocardiogram examinations.                  Risk Factors:Hypertension, Diabetes and HCL.  Sonographer:     Charmayne Sheer Referring Phys:  [id number] AGBATA Diagnosing Phys: Ida Rogue MD  Sonographer Comments: Suboptimal apical window and no subcostal window. Image acquisition challenging due to patient body habitus. IMPRESSIONS  1. Left ventricular ejection fraction, by estimation, is 60 to 65%. The left ventricle has normal function. The left ventricle has no regional wall motion abnormalities. Left ventricular diastolic parameters are consistent with Grade I diastolic dysfunction (impaired relaxation).  2. Right ventricular systolic function is moderately reduced. The right ventricular size is moderately enlarged. Tricuspid regurgitation signal is inadequate for assessing PA pressure.  3. Left atrial size was mildly dilated.  4. The mitral valve is normal in structure. Trivial mitral valve regurgitation. No evidence of mitral stenosis.  5. The aortic valve was not well visualized. Aortic valve regurgitation is not visualized. No aortic stenosis is present.  6. The inferior vena cava is dilated in size with <50% respiratory variability, suggesting right atrial pressure of 15 mmHg. FINDINGS  Left Ventricle: Left ventricular ejection fraction, by estimation, is 60 to 65%. The left ventricle has normal function. The left ventricle has no regional wall motion abnormalities. The left ventricular internal cavity size was normal in size. There is  no left ventricular hypertrophy. Left ventricular diastolic parameters are consistent with Grade I diastolic dysfunction (impaired relaxation). Right Ventricle: The right ventricular size is moderately enlarged. No increase in right ventricular wall thickness. Right ventricular systolic function is moderately reduced. Tricuspid regurgitation signal is inadequate for assessing PA pressure. Left Atrium: Left atrial size was mildly dilated. Right Atrium:  Right atrial size was normal in size. Pericardium: There is no evidence of pericardial effusion. Mitral Valve: The mitral valve is  normal in structure. Trivial mitral valve regurgitation. No evidence of mitral valve stenosis. MV peak gradient, 7.1 mmHg. The mean mitral valve gradient is 3.0 mmHg. Tricuspid Valve: The tricuspid valve is normal in structure. Tricuspid valve regurgitation is not demonstrated. No evidence of tricuspid stenosis. Aortic Valve: The aortic valve was not well visualized. Aortic valve regurgitation is not visualized. No aortic stenosis is present. Aortic valve mean gradient measures 5.0 mmHg. Aortic valve peak gradient measures 10.1 mmHg. Aortic valve area, by VTI measures 1.11 cm. Pulmonic Valve: The pulmonic valve was normal in structure. Pulmonic valve regurgitation is not visualized. No evidence of pulmonic stenosis. Aorta: The aortic root is normal in size and structure. Venous: The inferior vena cava is dilated in size with less than 50% respiratory variability, suggesting right atrial pressure of 15 mmHg. IAS/Shunts: No atrial level shunt detected by color flow Doppler.  LEFT VENTRICLE PLAX 2D LVIDd:         4.00 cm   Diastology LVIDs:         2.67 cm   LV e' medial:    6.96 cm/s LV PW:         1.14 cm   LV E/e' medial:  14.7 LV IVS:        0.67 cm   LV e' lateral:   7.62 cm/s LVOT diam:     1.40 cm   LV E/e' lateral: 13.4 LV SV:         32 LV SV Index:   17 LVOT Area:     1.54 cm  RIGHT VENTRICLE RV Basal diam:  4.27 cm RV Mid diam:    4.01 cm LEFT ATRIUM           Index        RIGHT ATRIUM           Index LA diam:      4.00 cm 2.19 cm/m   RA Area:     16.80 cm LA Vol (A4C): 44.0 ml 24.09 ml/m  RA Volume:   50.30 ml  27.54 ml/m  AORTIC VALVE                     PULMONIC VALVE AV Area (Vmax):    1.08 cm      PV Vmax:       1.03 m/s AV Area (Vmean):   0.94 cm      PV Vmean:      68.300 cm/s AV Area (VTI):     1.11 cm      PV VTI:        0.192 m AV Vmax:           159.00 cm/s    PV Peak grad:  4.2 mmHg AV Vmean:          106.000 cm/s  PV Mean grad:  2.0 mmHg AV VTI:            0.286 m AV Peak Grad:      10.1 mmHg AV Mean Grad:      5.0 mmHg LVOT Vmax:         112.00 cm/s LVOT Vmean:        64.500 cm/s LVOT VTI:          0.206 m LVOT/AV VTI ratio: 0.72  AORTA Ao Root diam: 2.10 cm MITRAL VALVE MV Area (PHT): 4.29 cm     SHUNTS MV Area VTI:   1.08 cm     Systemic VTI:  0.21 m MV Peak grad:  7.1 mmHg     Systemic Diam: 1.40 cm MV Mean grad:  3.0 mmHg MV Vmax:       1.33 m/s MV Vmean:      81.8 cm/s MV Decel Time: 177 msec MV E velocity: 102.00 cm/s MV A velocity: 118.00 cm/s MV E/A ratio:  0.86 Ida Rogue MD Electronically signed by Ida Rogue MD Signature Date/Time: 07/22/2021/1:08:53 PM    Final     Microbiology: Results for orders placed or performed during the hospital encounter of 07/21/21  Resp Panel by RT-PCR (Flu A&B, Covid) Nasopharyngeal Swab     Status: None   Collection Time: 07/21/21 10:52 AM   Specimen: Nasopharyngeal Swab; Nasopharyngeal(NP) swabs in vial transport medium  Result Value Ref Range Status   SARS Coronavirus 2 by RT PCR NEGATIVE NEGATIVE Final    Comment: (NOTE) SARS-CoV-2 target nucleic acids are NOT DETECTED.  The SARS-CoV-2 RNA is generally detectable in upper respiratory specimens during the acute phase of infection. The lowest concentration of SARS-CoV-2 viral copies this assay can detect is 138 copies/mL. A negative result does not preclude SARS-Cov-2 infection and should not be used as the sole basis for treatment or other patient management decisions. A negative result may occur with  improper specimen collection/handling, submission of specimen other than nasopharyngeal swab, presence of viral mutation(s) within the areas targeted by this assay, and inadequate number of viral copies(<138 copies/mL). A negative result must be combined with clinical observations, patient history, and epidemiological information. The expected  result is Negative.  Fact Sheet for Patients:  EntrepreneurPulse.com.au  Fact Sheet for Healthcare Providers:  IncredibleEmployment.be  This test is no t yet approved or cleared by the [place] FDA and  has been authorized for detection and/or diagnosis of SARS-CoV-2 by FDA under an Emergency Use Authorization (EUA). This EUA will remain  in effect (meaning this test can be used) for the duration of the COVID-19 declaration under Section 564(b)(1) of the Act, 21 U.S.C.section 360bbb-3(b)(1), unless the authorization is terminated  or revoked sooner.       Influenza A by PCR NEGATIVE NEGATIVE Final   Influenza B by PCR NEGATIVE NEGATIVE Final    Comment: (NOTE) The Xpert Xpress SARS-CoV-2/FLU/RSV plus assay is intended as an aid in the diagnosis of influenza from Nasopharyngeal swab specimens and should not be used as a sole basis for treatment. Nasal washings and aspirates are unacceptable for Xpert Xpress SARS-CoV-2/FLU/RSV testing.  Fact Sheet for Patients: EntrepreneurPulse.com.au  Fact Sheet for Healthcare Providers: IncredibleEmployment.be  This test is not yet approved or cleared by the [place] FDA and has been authorized for detection and/or diagnosis of SARS-CoV-2 by FDA under an Emergency Use Authorization (EUA). This EUA will remain in effect (meaning this test can be used) for the duration of the COVID-19 declaration under Section 564(b)(1) of the Act, 21 U.S.C. section 360bbb-3(b)(1), unless the authorization is terminated or revoked.  Performed at [facility], [address] [place], [place] [address]     Labs: CBC: Recent Labs  Lab 07/21/21 1052 07/22/21 0803 07/23/21 0433  WBC 10.8* 11.0* 14.0*  NEUTROABS 8.3*  --   --   HGB 6.3* 9.1* 9.4*  HCT 25.1* 31.9* 34.3*  MCV 61.1* 68.5* 68.6*  PLT 394 281 601   Basic Metabolic Panel: Recent Labs  Lab  07/21/21 1052 07/22/21 0803 07/23/21 0433 07/24/21 0402  NA 140 140 140 138  K 3.6 4.4 3.8 3.8  CL  102 101 97* 94*  CO2 27 32 34* 34*  GLUCOSE 198* 149* 198* 157*  BUN 16 14 14 14   CREATININE 0.88 0.64 0.92 0.80  CALCIUM 8.5* 8.4* 8.3* 8.2*  MG  --   --  1.5* 1.6*   Liver Function Tests: Recent Labs  Lab 07/21/21 1052  AST 74*  ALT 86*  ALKPHOS 95  BILITOT 1.0  PROT 6.9  ALBUMIN 3.1*   CBG: Recent Labs  Lab 07/23/21 1207 07/23/21 1635 07/23/21 2135 07/24/21 0730 07/24/21 1225  GLUCAP 213* 221* 118* 147* 332*    Discharge time spent: greater than 30 minutes.  Signed: Loletha Grayer, MD [facility] 07/24/2021

## 2021-07-24 NOTE — Assessment & Plan Note (Signed)
BMI 42.58.  To monitor as outpatient.

## 2021-07-24 NOTE — Progress Notes (Signed)
SATURATION QUALIFICATIONS: (This note is used to comply with regulatory documentation for home oxygen)  Patient Saturations on Room Air at Rest = 79%  Patient Saturations on Room Air while Ambulating = 74%  Patient Saturations on 2 Liters of oxygen while Ambulating = 92%  Please briefly explain why patient needs home oxygen:  Patient saturation at rest on RA 79%.  Resident stood up while on RA O2 sats fluctuated 74%-77%.  2L O2 per [employer] applied with O2 sats up to 95%.  Patient sats while ambulating 91%-92%.

## 2021-07-24 NOTE — Assessment & Plan Note (Signed)
Potassium replacement prescribed during the hospital course and upon discharge

## 2021-08-04 ENCOUNTER — Encounter: Payer: Self-pay | Admitting: Family

## 2021-08-04 ENCOUNTER — Other Ambulatory Visit: Payer: Self-pay

## 2021-08-04 ENCOUNTER — Ambulatory Visit: Payer: Medicare Other | Attending: Family | Admitting: Family

## 2021-08-04 VITALS — BP 136/85 | HR 94 | Resp 18 | Ht 66.0 in | Wt 202.6 lb

## 2021-08-04 DIAGNOSIS — Z79899 Other long term (current) drug therapy: Secondary | ICD-10-CM | POA: Diagnosis not present

## 2021-08-04 DIAGNOSIS — R0602 Shortness of breath: Secondary | ICD-10-CM | POA: Insufficient documentation

## 2021-08-04 DIAGNOSIS — F17210 Nicotine dependence, cigarettes, uncomplicated: Secondary | ICD-10-CM | POA: Diagnosis not present

## 2021-08-04 DIAGNOSIS — I110 Hypertensive heart disease with heart failure: Secondary | ICD-10-CM | POA: Insufficient documentation

## 2021-08-04 DIAGNOSIS — Z7984 Long term (current) use of oral hypoglycemic drugs: Secondary | ICD-10-CM | POA: Diagnosis not present

## 2021-08-04 DIAGNOSIS — D649 Anemia, unspecified: Secondary | ICD-10-CM | POA: Diagnosis not present

## 2021-08-04 DIAGNOSIS — E119 Type 2 diabetes mellitus without complications: Secondary | ICD-10-CM | POA: Diagnosis not present

## 2021-08-04 DIAGNOSIS — M549 Dorsalgia, unspecified: Secondary | ICD-10-CM | POA: Insufficient documentation

## 2021-08-04 DIAGNOSIS — E785 Hyperlipidemia, unspecified: Secondary | ICD-10-CM

## 2021-08-04 DIAGNOSIS — Z9981 Dependence on supplemental oxygen: Secondary | ICD-10-CM | POA: Insufficient documentation

## 2021-08-04 DIAGNOSIS — Z7951 Long term (current) use of inhaled steroids: Secondary | ICD-10-CM | POA: Insufficient documentation

## 2021-08-04 DIAGNOSIS — I10 Essential (primary) hypertension: Secondary | ICD-10-CM | POA: Diagnosis not present

## 2021-08-04 DIAGNOSIS — I5032 Chronic diastolic (congestive) heart failure: Secondary | ICD-10-CM

## 2021-08-04 DIAGNOSIS — Z636 Dependent relative needing care at home: Secondary | ICD-10-CM | POA: Insufficient documentation

## 2021-08-04 DIAGNOSIS — E1169 Type 2 diabetes mellitus with other specified complication: Secondary | ICD-10-CM

## 2021-08-04 MED ORDER — EMPAGLIFLOZIN 10 MG PO TABS: 10.0000 mg | ORAL_TABLET | Freq: Every day | ORAL | 5 refills | Status: DC

## 2021-08-04 NOTE — Patient Instructions (Signed)
Start taking Jardiance once a day.  Give the coupon to [facility] [employer] and your first month will be free.  Start weighing daily.  Call the Heart Failure office if you have a wt gain of 2-3 lbs overnight or 5 lbs/week.  Get some compression socks and start wearing daily, removed when you sleep.  Return in 1 month.

## 2021-08-04 NOTE — Progress Notes (Signed)
Patient: Robin [name]  DOB: [birth year] 71 yo  MRN: [id number]  Robin [name] is a 71 y.o. female with medical history significant of nicotine dependence, hypertension, hyperlipidemia, anemia, diabetes mellitus.   Echo results reviewed from 07/22/21, EF 86-57%, diastolic parameters consistent with Grade 1 diastolic dysfunction. RV moderately enlarged, LA mildly dilated.   Recently admitted 07/24/21 for acute on chronic respiratory failure, acute on chronic diastolic HF, symptomatic anemia requiring blood transfusions.   She presents to the Heart Failure clinic today for her initial visit with a chief complaint of moderate fatigue with minimal exertion. She endorses SOB, cough as well as pedal edema. She denies CP, palpitations, abdominal edema, orthopnea, dizziness, syncope.   She does not weigh her self daily, check her BP or BS at home.  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   Past Medical History:  Diagnosis Date   Anemia    Cellulitis    CHF (congestive heart failure) ([place])    Diabetes mellitus without complication (HCC)    High cholesterol    Hypertension    No past surgical history on file. Family History  Problem Relation Age of Onset   Breast cancer Sister 39   Social History   Tobacco Use   Smoking status: Every Day    Packs/day: 1.00    Years: 47.00    Pack years: 47.00    Types: Cigarettes   Smokeless tobacco: Never  Substance Use Topics   Alcohol use: Yes    Comment: rare   No Known Allergies Prior to Admission medications   Medication Sig Start Date End Date Taking? Authorizing Provider  albuterol (PROVENTIL HFA;VENTOLIN HFA) 108 (90 Base) MCG/ACT inhaler Inhale 2 puffs into the lungs every 4 (four) hours as needed for wheezing or shortness of breath. 06/22/16  Yes Frederich Cha, MD  atorvastatin (LIPITOR) 20 MG tablet Take 20 mg by mouth daily. 05/03/21  Yes [provider]  empagliflozin  (JARDIANCE) 10 MG TABS tablet Take 1 tablet (10 mg total) by mouth daily before breakfast. 08/04/21  Yes Hackney, Otila Kluver A, FNP  feeding supplement (ENSURE ENLIVE / ENSURE PLUS) LIQD Take 237 mLs by mouth 2 (two) times daily between meals. 07/24/21  Yes Wieting, Richard, MD  furosemide (LASIX) 40 MG tablet Take 1 tablet (40 mg total) by mouth 2 (two) times daily. 07/24/21  Yes Wieting, Richard, MD  magnesium oxide (MAG-OX) 400 (240 Mg) MG tablet Take 1 tablet (400 mg total) by mouth 2 (two) times daily. 07/24/21  Yes Wieting, Richard, MD  metFORMIN (GLUCOPHAGE-XR) 500 MG 24 hr tablet Take 500 mg by mouth 2 (two) times daily. 07/11/21  Yes [provider]  metoprolol succinate (TOPROL XL) 25 MG 24 hr tablet Take 0.5 tablets (12.5 mg total) by mouth at bedtime. 07/24/21  Yes Wieting, Richard, MD  mometasone-formoterol (DULERA) 100-5 MCG/ACT AERO Inhale 2 puffs into the lungs 2 (two) times daily. 07/24/21  Yes Wieting, Richard, MD  potassium chloride SA (KLOR-CON M) 20 MEQ tablet Take 1 tablet (20 mEq total) by mouth 2 (two) times daily. 07/24/21  Yes Loletha Grayer, MD   Review of Systems  Constitutional:  Positive for malaise/fatigue.  HENT: Negative.    Eyes:  Negative for blurred vision and double vision.  Respiratory:  Positive for cough, shortness of breath and wheezing. Negative for sputum production.   Cardiovascular:  Positive for leg swelling. Negative for chest pain, palpitations and orthopnea.  Gastrointestinal:  Negative.   Genitourinary: Negative.   Musculoskeletal:  Positive for back pain.  Skin: Negative.   Neurological:  Negative for dizziness and weakness.  Endo/Heme/Allergies: Negative.   Psychiatric/Behavioral:  Negative for depression. The patient is not nervous/anxious.     Physical Exam Vitals and nursing note reviewed.  Constitutional:      General: She is not in acute distress.    Appearance: She is well-developed.  Neck:     Vascular: No carotid bruit.  Cardiovascular:      Rate and Rhythm: Normal rate and regular rhythm.  Pulmonary:     Breath sounds: Decreased breath sounds and wheezing present. No rhonchi or rales.  Musculoskeletal:     Right lower leg: Edema (+1) present.     Left lower leg: Edema (+1) present.  Neurological:     Mental Status: She is alert.    Lab Results  Component Value Date   CREATININE 0.80 07/24/2021   CREATININE 0.92 07/23/2021   CREATININE 0.64 07/22/2021    Filed Weights   08/04/21 1433  Weight: 202 lb 9 oz (91.9 kg)   Vitals with BMI 08/04/2021 07/24/2021 07/24/2021  Height 5\' 6"  - -  Weight 202 lbs 9 oz - -  BMI 01.60 - -  Systolic 109 323 557  Diastolic 85 48 77  Pulse 94 89 91     Assessment & Plan:  1: Heart failure with preserved ejection fraction, grade 1 diastolic dysfunction: - NYHA II - euvolemic - not weighing daily, does not have a scale - provided scale and instructed to call for weight gain 2-3lbs/night or 5 lbs/week  - educated on low sodium diet, patient advises she does not add salt to her food, provided low sodium cookbook - on metoprolol succinate - will add jardiance 10 mg qd - check labs next visit - BNP 07/21/21 was 790.4  2: HTN: - BP 136/85 - on metoprolol, was on lisinopril but it was stopped during last hospitalization - BMP 07/24/21 sodium 138, potassium 3.8, creatinine 0.80, GFR > 60  3: Diabetes: - A1C 7.3% on 07/21/21 - on glucophage 500 mg BID - has f/u appt with [facility] on 08/24/21  4: SOB: - complete smoking cessation at hospital discharge - she denies SOB in office, but RR (24 at rest) is elevated and LS are diminshed with expiratory wheezing, instructed to use inhaler that she had in purse - LS improved after use of inhaler - discharged with home O2, she wears usually when she sleeps which is in the day time as 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 She did not bring her medication bottles with her, encouraged her to do so at  next visit.  Return in 1 month or sooner if needed.

## 2021-08-28 NOTE — Progress Notes (Deleted)
Patient: Robin [name]  DOB: July 07, 1950 71 yo  MRN: [id number]  Robin [name] is a 71 y.o. female with medical history significant of nicotine dependence, hypertension, hyperlipidemia, anemia, diabetes mellitus.   Echo results reviewed from 07/22/21, EF 17-00%, diastolic parameters consistent with Grade 1 diastolic dysfunction. RV moderately enlarged, LA mildly dilated.   Recently admitted 07/24/21 for acute on chronic respiratory failure, acute on chronic diastolic HF, symptomatic anemia requiring blood transfusions.   She presents to the Heart Failure clinic today for her initial visit with a chief complaint of moderate fatigue with minimal exertion. She endorses SOB, cough as well as pedal edema. She denies CP, palpitations, abdominal edema, orthopnea, dizziness, syncope.   She does not weigh her self daily, check her BP or BS at home.  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   Past Medical History:  Diagnosis Date   Anemia    Cellulitis    CHF (congestive heart failure) ([place])    Diabetes mellitus without complication (HCC)    High cholesterol    Hypertension    No past surgical history on file. Family History  Problem Relation Age of Onset   Breast cancer Sister 48   Social History   Tobacco Use   Smoking status: Every Day    Packs/day: 1.00    Years: 47.00    Pack years: 47.00    Types: Cigarettes   Smokeless tobacco: Never  Substance Use Topics   Alcohol use: Yes    Comment: rare   No Known Allergies Prior to Admission medications   Medication Sig Start Date End Date Taking? Authorizing Provider  albuterol (PROVENTIL HFA;VENTOLIN HFA) 108 (90 Base) MCG/ACT inhaler Inhale 2 puffs into the lungs every 4 (four) hours as needed for wheezing or shortness of breath. 06/22/16  Yes Frederich Cha, MD  atorvastatin (LIPITOR) 20 MG tablet Take 20 mg by mouth daily. 05/03/21  Yes [provider]  empagliflozin  (JARDIANCE) 10 MG TABS tablet Take 1 tablet (10 mg total) by mouth daily before breakfast. 08/04/21  Yes Hackney, Otila Kluver A, FNP  feeding supplement (ENSURE ENLIVE / ENSURE PLUS) LIQD Take 237 mLs by mouth 2 (two) times daily between meals. 07/24/21  Yes Wieting, Richard, MD  furosemide (LASIX) 40 MG tablet Take 1 tablet (40 mg total) by mouth 2 (two) times daily. 07/24/21  Yes Wieting, Richard, MD  magnesium oxide (MAG-OX) 400 (240 Mg) MG tablet Take 1 tablet (400 mg total) by mouth 2 (two) times daily. 07/24/21  Yes Wieting, Richard, MD  metFORMIN (GLUCOPHAGE-XR) 500 MG 24 hr tablet Take 500 mg by mouth 2 (two) times daily. 07/11/21  Yes [provider]  metoprolol succinate (TOPROL XL) 25 MG 24 hr tablet Take 0.5 tablets (12.5 mg total) by mouth at bedtime. 07/24/21  Yes Wieting, Richard, MD  mometasone-formoterol (DULERA) 100-5 MCG/ACT AERO Inhale 2 puffs into the lungs 2 (two) times daily. 07/24/21  Yes Wieting, Richard, MD  potassium chloride SA (KLOR-CON M) 20 MEQ tablet Take 1 tablet (20 mEq total) by mouth 2 (two) times daily. 07/24/21  Yes Loletha Grayer, MD   Review of Systems  Constitutional:  Positive for malaise/fatigue.  HENT: Negative.    Eyes:  Negative for blurred vision and double vision.  Respiratory:  Positive for cough, shortness of breath and wheezing. Negative for sputum production.   Cardiovascular:  Positive for leg swelling. Negative for chest pain, palpitations and orthopnea.  Gastrointestinal:  Negative.   Genitourinary: Negative.   Musculoskeletal:  Positive for back pain.  Skin: Negative.   Neurological:  Negative for dizziness and weakness.  Endo/Heme/Allergies: Negative.   Psychiatric/Behavioral:  Negative for depression. The patient is not nervous/anxious.     Physical Exam Vitals and nursing note reviewed.  Constitutional:      General: She is not in acute distress.    Appearance: She is well-developed.  Neck:     Vascular: No carotid bruit.  Cardiovascular:      Rate and Rhythm: Normal rate and regular rhythm.  Pulmonary:     Breath sounds: Decreased breath sounds and wheezing present. No rhonchi or rales.  Musculoskeletal:     Right lower leg: Edema (+1) present.     Left lower leg: Edema (+1) present.  Neurological:     Mental Status: She is alert.    Lab Results  Component Value Date   CREATININE 0.80 07/24/2021   CREATININE 0.92 07/23/2021   CREATININE 0.64 07/22/2021    There were no vitals filed for this visit.  Vitals with BMI 08/04/2021 07/24/2021 07/24/2021  Height '5\' 6"'$  - -  Weight 202 lbs 9 oz - -  BMI 65.68 - -  Systolic 127 517 001  Diastolic 85 48 77  Pulse 94 89 91     Assessment & Plan:  1: Heart failure with preserved ejection fraction, grade 1 diastolic dysfunction: - NYHA II - euvolemic - not weighing daily, does not have a scale - provided scale and instructed to call for weight gain 2-3lbs/night or 5 lbs/week  - educated on low sodium diet, patient advises she does not add salt to her food, provided low sodium cookbook - on metoprolol succinate - will add jardiance 10 mg qd - check labs next visit - BNP 07/21/21 was 790.4  2: HTN: - BP 136/85 - on metoprolol, was on lisinopril but it was stopped during last hospitalization - BMP 07/24/21 sodium 138, potassium 3.8, creatinine 0.80, GFR > 60  3: Diabetes: - A1C 7.3% on 07/21/21 - on glucophage 500 mg BID - has f/u appt with [facility] on 08/24/21  4: SOB: - complete smoking cessation at hospital discharge - she denies SOB in office, but RR (24 at rest) is elevated and LS are diminshed with expiratory wheezing, instructed to use inhaler that she had in purse - LS improved after use of inhaler - discharged with home O2, she wears usually when she sleeps which is in the day time as 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 She did not bring her medication bottles with her, encouraged her to do so at next  visit.  Return in 1 month or sooner if needed.

## 2021-08-29 ENCOUNTER — Ambulatory Visit: Payer: Medicare Other | Admitting: Family

## 2021-09-11 NOTE — Progress Notes (Signed)
Patient: Robin [name]  DOB: 1950/07/28 71 yo  MRN: [id number] ? ?Robin [name] is a 71 y.o. female with medical history significant of nicotine dependence, hypertension, hyperlipidemia, anemia, diabetes mellitus.  ? ?Echo results reviewed from 07/22/21, EF 28-36%, diastolic parameters consistent with Grade 1 diastolic dysfunction. RV moderately enlarged, LA mildly dilated.  ? ?Recently admitted 07/24/21 for acute on chronic respiratory failure, acute on chronic diastolic HF, symptomatic anemia requiring blood transfusions.  ? ?She presents to the Heart Failure clinic today for a follow up visit with a chief complaint of moderate fatigue with moderate exertion and DOE. She denies CP, palpitations, abdominal edema, orthopnea, dizziness, syncope, weight gain, or pedal edema.  ? ?She does not weigh her self daily, check her BP or BS at home. ? 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  ? ?Past Medical History:  ?Diagnosis Date  ? Anemia   ? Cellulitis   ? CHF (congestive heart failure) ([place])   ? Diabetes mellitus without complication ([place])   ? High cholesterol   ? Hypertension   ? ?No past surgical history on file. ?Family History  ?Problem Relation Age of Onset  ? Breast cancer Sister 89  ? ?Social History  ? ?Tobacco Use  ? Smoking status: Every Day  ?  Packs/day: 1.00  ?  Years: 47.00  ?  Pack years: 47.00  ?  Types: Cigarettes  ? Smokeless tobacco: Never  ?Substance Use Topics  ? Alcohol use: Yes  ?  Comment: rare  ? ?No Known Allergies ? ?Prior to Admission medications   ?Medication Sig Start Date End Date Taking? Authorizing Provider  ?albuterol (PROVENTIL HFA;VENTOLIN HFA) 108 (90 Base) MCG/ACT inhaler Inhale 2 puffs into the lungs every 4 (four) hours as needed for wheezing or shortness of breath. 06/22/16  Yes Frederich Cha, MD  ?atorvastatin (LIPITOR) 20 MG tablet Take 20 mg by mouth daily. 05/03/21  Yes [provider]  ?empagliflozin (JARDIANCE) 10 MG  TABS tablet Take 1 tablet (10 mg total) by mouth daily before breakfast. 08/04/21  Yes Alisa Graff, FNP  ?feeding supplement (ENSURE ENLIVE / ENSURE PLUS) LIQD Take 237 mLs by mouth 2 (two) times daily between meals. 07/24/21  Yes Wieting, Richard, MD  ?furosemide (LASIX) 40 MG tablet Take 1 tablet (40 mg total) by mouth 2 (two) times daily. 07/24/21  Yes Wieting, Richard, MD  ?magnesium oxide (MAG-OX) 400 (240 Mg) MG tablet Take 1 tablet (400 mg total) by mouth 2 (two) times daily. 07/24/21  Yes Wieting, Richard, MD  ?metFORMIN (GLUCOPHAGE-XR) 500 MG 24 hr tablet Take 500 mg by mouth 2 (two) times daily. 07/11/21  Yes [provider]  ?metoprolol succinate (TOPROL XL) 25 MG 24 hr tablet Take 0.5 tablets (12.5 mg total) by mouth at bedtime. 07/24/21  Yes Wieting, Richard, MD  ?mometasone-formoterol (DULERA) 100-5 MCG/ACT AERO Inhale 2 puffs into the lungs 2 (two) times daily. 07/24/21  Yes Wieting, Richard, MD  ?potassium chloride SA (KLOR-CON M) 20 MEQ tablet Take 1 tablet (20 mEq total) by mouth 2 (two) times daily. 07/24/21  Yes Loletha Grayer, MD  ? Review of Systems  ?Constitutional:  Positive for malaise/fatigue (improving).  ?HENT: Negative.    ?Eyes:  Negative for blurred vision and double vision.  ?Respiratory:  Positive for cough and shortness of breath. Negative for sputum production and wheezing.   ?Cardiovascular:  Positive for leg swelling. Negative for chest pain, palpitations and orthopnea.  ?  Gastrointestinal: Negative.   ?Genitourinary: Negative.   ?Musculoskeletal:  Positive for back pain.  ?Skin: Negative.   ?Neurological:  Negative for dizziness and weakness.  ?Endo/Heme/Allergies: Negative.   ?Psychiatric/Behavioral:  Negative for depression. The patient is not nervous/anxious.    ? ?Physical Exam ?Vitals and nursing note reviewed. Exam conducted with a chaperone present (daughter).  ?Constitutional:   ?   General: She is not in acute distress. ?   Appearance: Normal appearance. She is  well-developed. She is not ill-appearing.  ?Neck:  ?   Vascular: No carotid bruit.  ?Cardiovascular:  ?   Rate and Rhythm: Normal rate and regular rhythm.  ?Pulmonary:  ?   Breath sounds: Decreased breath sounds present. No wheezing, rhonchi or rales.  ?Abdominal:  ?   Palpations: Abdomen is soft.  ?Musculoskeletal:  ?   Right lower leg: Edema (trace) present.  ?   Left lower leg: Edema (trace) present.  ?Neurological:  ?   Mental Status: She is alert and oriented to person, place, and time.  ?  ?Vitals:  ? 09/12/21 1009  ?BP: (!) 129/50  ?Pulse: 69  ?Resp: 18  ?SpO2: 100%  ?Weight: 196 lb 4 oz (89 kg)  ?Height: 5' (1.524 m)  ? ?Wt Readings from Last 3 Encounters:  ?09/12/21 196 lb 4 oz (89 kg)  ?08/04/21 202 lb 9 oz (91.9 kg)  ?07/24/21 218 lb 0.6 oz (98.9 kg)  ? ? ?Lab Results  ?Component Value Date  ? CREATININE 0.80 07/24/2021  ? CREATININE 0.92 07/23/2021  ? CREATININE 0.64 07/22/2021  ?  ?Assessment & Plan: ? ?1: Heart failure with preserved ejection fraction, grade 1 diastolic dysfunction: ?- NYHA II ?- euvolemic ?- has a scale but is not weighing daily; encouraged to begin weighing daily ?- weight 196, down 6 lbs from last visit on 08/04/21 ?- discussed being highly alert for pedal edema, abdominal swelling and other worsening HF symptoms if she is unable to weigh ?- adhering to a low-sodium diet ?- jardiance started at last visit ?- had labwork at [facility] three weeks ago, requested BMP results: 08/22/21 Na 136, K 4.7, Cr 0.86, gfr 74 ?- was on metoprolol but she ran out several weeks ago and was not aware she was to continue taking ?- no longer taking lasix ?- will restart her metoprolol 12.5 mg PO QD ?- BNP 07/21/21 was 790.4 ?- 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 ?2: HTN: ?- BP 129/50 ?- restarting metoprolol ?- had labwork at [facility] three weeks ago, requested BMP results: 08/22/21 Na 136, K 4.7, Cr 0.86,  gfr 74 ? ?3: Diabetes: ?- A1C 7.3% on 07/21/21 ?- on glucophage 500 mg BID ?- has f/u appt with [facility] on 10/06/21 ? ?4: SOB: ?- complete smoking cessation since hospital discharge ?- has O2 at home, she wears occasionally at night ? ? ?Reviewed medication list. She did not bring her medication bottles with her, encouraged her to do so at next visit. ? ?Return in 2 months, or sooner if needed.  ? ?

## 2021-09-12 ENCOUNTER — Other Ambulatory Visit: Payer: Self-pay

## 2021-09-12 ENCOUNTER — Encounter: Payer: Self-pay | Admitting: Family

## 2021-09-12 ENCOUNTER — Ambulatory Visit: Payer: Medicare Other | Attending: Family | Admitting: Family

## 2021-09-12 VITALS — BP 129/50 | HR 69 | Resp 18 | Ht 60.0 in | Wt 196.2 lb

## 2021-09-12 DIAGNOSIS — E7800 Pure hypercholesterolemia, unspecified: Secondary | ICD-10-CM | POA: Insufficient documentation

## 2021-09-12 DIAGNOSIS — E1169 Type 2 diabetes mellitus with other specified complication: Secondary | ICD-10-CM

## 2021-09-12 DIAGNOSIS — M549 Dorsalgia, unspecified: Secondary | ICD-10-CM | POA: Insufficient documentation

## 2021-09-12 DIAGNOSIS — I5032 Chronic diastolic (congestive) heart failure: Secondary | ICD-10-CM | POA: Diagnosis not present

## 2021-09-12 DIAGNOSIS — R0602 Shortness of breath: Secondary | ICD-10-CM | POA: Diagnosis not present

## 2021-09-12 DIAGNOSIS — Z87891 Personal history of nicotine dependence: Secondary | ICD-10-CM | POA: Diagnosis not present

## 2021-09-12 DIAGNOSIS — I110 Hypertensive heart disease with heart failure: Secondary | ICD-10-CM | POA: Insufficient documentation

## 2021-09-12 DIAGNOSIS — E119 Type 2 diabetes mellitus without complications: Secondary | ICD-10-CM | POA: Diagnosis not present

## 2021-09-12 DIAGNOSIS — Z7984 Long term (current) use of oral hypoglycemic drugs: Secondary | ICD-10-CM | POA: Diagnosis not present

## 2021-09-12 DIAGNOSIS — D649 Anemia, unspecified: Secondary | ICD-10-CM | POA: Diagnosis not present

## 2021-09-12 DIAGNOSIS — Z9981 Dependence on supplemental oxygen: Secondary | ICD-10-CM | POA: Insufficient documentation

## 2021-09-12 DIAGNOSIS — I10 Essential (primary) hypertension: Secondary | ICD-10-CM | POA: Diagnosis not present

## 2021-09-12 DIAGNOSIS — E785 Hyperlipidemia, unspecified: Secondary | ICD-10-CM

## 2021-09-12 DIAGNOSIS — Z79899 Other long term (current) drug therapy: Secondary | ICD-10-CM | POA: Insufficient documentation

## 2021-09-12 MED ORDER — METOPROLOL SUCCINATE ER 25 MG PO TB24: 12.5000 mg | ORAL_TABLET | Freq: Every day | ORAL | 5 refills | Status: DC

## 2021-09-12 NOTE — Patient Instructions (Signed)
If you do not weigh every day, keep an eye on your leg swelling and if your pants are feeling tight on your waist. ? ?Restart your metoprolol, this was sent to the [facility] for you. ? ?Restrict your fluid intake to ~ 64 ounces/day.  ? ?Restrict your salt intake to ~ 2 g/day.  ? ?Return in 2 months.  ?

## 2021-09-13 NOTE — Telephone Encounter (Signed)
Left message asking the [facility]on[facility] to call back to schedule an Annual Wellness Visit with Tammy. Patient is eligible as of 07/05/2019. Please notify me when the patient calls back at [phone]. Thank you.

## 2021-09-13 NOTE — Telephone Encounter (Signed)
I spoke with the patient, and they would like a call back in a couple hours to be scheduled for their Medicare Wellness Visit.    Patient is eligible as of 07/05/2019. Please notify me when the patient calls back at [phone]. Thank you.

## 2021-09-16 NOTE — Telephone Encounter (Signed)
Annual Wellness visit scheduled with [facility] nurse on 09/26/2021 @ 1:00 p.m. elig. 07/05/2019.

## 2021-09-19 ENCOUNTER — Other Ambulatory Visit: Payer: Self-pay | Admitting: Family

## 2021-09-22 MED ORDER — tranylcypromine (PARNATE) 10 mg tablet
10 | ORAL_TABLET | ORAL | 0 refills | Status: DC
Start: 2021-09-22 — End: 2021-12-23

## 2021-09-22 MED ORDER — lovastatin (MEVACOR) 40 mg tablet
40 | ORAL_TABLET | ORAL | 0 refills | Status: DC
Start: 2021-09-22 — End: 2021-12-23

## 2021-09-22 NOTE — Telephone Encounter (Signed)
Last OV 12/31/2020  Last Labs: 12/28/2020    Spoke with patient and relayed message below, patient verbalized understanding and had no further questions. Patient is scheduled for 09/26/21    Requested Prescriptions     Pending Prescriptions Disp Refills   . tranylcypromine (PARNATE) 10 mg tablet 450 tablet 2     Sig: TAKE TWO TABLETS BY MOUTH EVERY MORNING AND TAKE THREE TABLETS BY MOUTH EVERY NIGHT AT BEDTIME   . lovastatin (MEVACOR) 40 mg tablet 90 tablet 2     Sig: TAKE ONE TABLET BY MOUTH EVERY NIGHT AT BEDTIME

## 2021-09-22 NOTE — Telephone Encounter (Signed)
Medication Being Requested:  Lovastatin 40mg . tranylcypromine (PARNATE) 10 mg tablet    Send To Pharmacy: [facility]    How many Days Remaining of medication: 3  The patient does request a return call.    Additional Information:     Patient states she is changing to to [facility]-Medication is way cheaper.     **Informed patient that it may take up to 48-72 hours to fill prescription.**    Future Appointments:  Future Appointments   Date Time Provider Department Center   09/26/2021  1:00 PM Genelle Gather, LPN [id number]       Last Appointment this Department:  Last Office Visit: 12/31/2020 Carmell Austria, DO  Last Telemed Visit: Visit date not found  Last Telemed2 Visit: Visit date not found

## 2021-09-26 ENCOUNTER — Ambulatory Visit: Admit: 2021-09-26 | Discharge: 2021-09-30 | Payer: MEDICARE | Attending: DO | Primary: DO

## 2021-09-26 ENCOUNTER — Encounter: Payer: MEDICARE | Primary: DO

## 2021-09-26 DIAGNOSIS — F3342 Major depressive disorder, recurrent, in full remission: Secondary | ICD-10-CM

## 2021-09-26 NOTE — Progress Notes (Signed)
[name] is a 71 y.o. female seen today for Follow-up  .    SUBJECTIVE:  HPI  Patient presents today for 6 month follow and hyperlipidemia.     The following portions of the patient's history were reviewed and updated as appropriate: allergies, current medications, past family history, past medical history, past surgical history and problem list.    Current Outpatient Medications on File Prior to Visit   Medication Sig Dispense Refill   . cholecalciferol, vitamin D3, 5,000 unit capsule Take 1 capsule by mouth daily. 1 capsule 0   . lovastatin (MEVACOR) 40 mg tablet TAKE ONE TABLET BY MOUTH EVERY NIGHT AT BEDTIME 90 tablet 0   . tranylcypromine (PARNATE) 10 mg tablet TAKE TWO TABLETS BY MOUTH EVERY MORNING AND TAKE THREE TABLETS BY MOUTH EVERY NIGHT AT BEDTIME 450 tablet 0     No current facility-administered medications on file prior to visit.       Allergies   Allergen Reactions   . Nitrous Oxide Nausea And Vomiting   . Codeine Nausea And Vomiting       Past Medical History:   Diagnosis Date   . Bronchitis    . Depression     years ago   . GERD (gastroesophageal reflux disease)     intermittent-no Rx   . Heart murmur     ?   [name]        Past Surgical History:   Procedure Laterality Date   . BREAST CYST ASPIRATION Left    . DILATION AND CURETTAGE OF UTERUS     . PONV after dental     . PROCEDURE Left 11/03/2011    Procedure: OPEN REDUCTION INTERNAL FIXATION, BIMALLEOLAR  ANKLE;  Surgeon: [name], MD;  Location: [facility] OR;  Service: Orthopedics;  Laterality: Left;   . tonsils         Family History   Problem Relation Age of Onset   . Alzheimer's disease Mother    . Arthritis Father    . Lupus Sister    . Diabetes Brother    . No Known Health Problems Sister        Social History     Socioeconomic History   . Marital status: Married     Spouse name: [name]   . Number of children: 4   Tobacco Use   . Smoking status: Former     Packs/day: 0.50     Years: 30.00     Pack years: 15.00     Types:  Cigarettes     Start date: 1989     Quit date: 06/02/2018     Years since quitting: 3.3   . Smokeless tobacco: Never   Substance and Sexual Activity   . Alcohol use: No   . Drug use: No   . Sexual activity: Yes     Partners: Male   Other Topics Concern   . Caffeine Concern Yes     Comment: occasional [employer]   . Exercise No       REVIEW OF SYSTEMS:  Review of Systems   Constitutional: Negative for chills, fatigue and fever.   Respiratory: Negative for cough and shortness of breath.    Cardiovascular: Negative for chest pain.       OBJECTIVE:  BP 112/70 (BP Location: Left arm, Patient Position: Sitting)   Pulse 87   Temp 36.8 ?C (98.3 ?F) (Oral)   Ht 5' 2.5 (1.588 m)   Wt 166 lb  6.4 oz (75.5 kg)   SpO2 97%   BMI 29.95 kg/m?   Body mass index is 29.95 kg/m?[name]    PHYSICAL EXAM:  Gen: no acute distress, nontoxic, alert, oriented, well nourished.  Resp: good air movement, no retractions, symmetrical breath sounds, without rales, rhonchi, or wheezing  CV: RRR, normal S1, S2; no murmur or gallop  Ext: no cyanosis, edema, or varicose veins. Good pulses throughout.  Psych: normal affect w/o depression or anxiety. No suicidal or homicidal ideation or plan.         ASSESSMENT & PLAN:  (F33.42) Recurrent major depressive disorder, in full remission (HCC)  (primary encounter diagnosis)    (E78.2) Mixed hyperlipidemia    (R73.9) Hyperglycemia    (Z12.31) Breast cancer screening by mammogram  Plan: Mammography tomo screening bilateral digital w         cad    (Z12.11) Screening for colon cancer  Plan: Referral to Gastroenterology (Gastroenterology         Consultants)    Continue with parnate and lowfat\chol diet. Mammo. Colonoscopy and labs, when due.    I had a thoughtful and careful discussion regarding the issues today. All questions were answered. I educated and reassured. I encouraged follow up as needed.       This note was transcribed using speech recognition software. Please contact us for clarification if any  questions arise relating to the wording of this document.  In 2016, Congress passed the 21st Century Cures Act. The Open Notes rules of the Act went into effect in April, 2021. This law requires that medical notes such as this be made available to patients free of charge in the interest of transparency. However, please be advised that this note is a medical document. It is intended as peer to peer communication and is written in medical language which may contain abbreviations or verbiage that are unfamiliar. It may appear blunt or direct. Medical documents are intended to carry relevant information, facts as evident, and the clinical opinion of the practitioner.

## 2021-09-26 NOTE — Telephone Encounter (Signed)
Left message asking Laisha to call back to reschedule their Annual Wellness Visit with Tammy due to no show today. Patient is eligible as of 07/05/2019. Please notify me when the patient calls back at [phone]. Thank you.

## 2021-09-26 NOTE — Progress Notes (Deleted)
Assessment      Subjective:     [name] is a 71 y.o. female who presents for a {Welcome to Medicare Visit Type:2104::subsequent Medicare Annual Wellness visit}    List of Healthcare Providers and/or Medical suppliers involved in patient's care:    Patient Care Team:  Carmell Austria, DO as PCP - General (Family Medicine)    Review of Histories:  The following portions of the patient's history were reviewed and updated as appropriate: {history reviewed:20406::allergies,current medications,past family history,past medical history,past social history,past surgical history,problem list}.    ROS                    Personalized Written Plan:    Last OV: 12/31/2020 with PCP.    Follow up/Additional:Next Visit:  1)    [name] denies any additional questions or concerns at this visit.  [name] is encouraged to call with any further questions or concerns.    Orders Placed During Visit:  No orders of the defined types were placed in this encounter.        Vitals:  not currently breastfeeding.  There is no height or weight on file to calculate BMI.    Vaccines:  Immunization History   Administered Date(s) Administered   . FLU 65+yrs QUADRIVALENT ADJUVANTED INJ(PF) 04/05/2020, 06/21/2021   . Flu 58mo-95yrs Quadrivalent Inj(PF) 05/09/2017   . Flu High Dose (PF) 03/21/2018, 05/09/2019   . Pfizer 12+ yrs SARS-CoV-2 vaccine (PURPLE cap) 10/17/2019, 11/07/2019, 05/21/2020   . Pneumococcal Conjugate 13-Valent 03/21/2018   . Pneumococcal Polysaccharide 05/09/2019        The CDC and ACIP:  Influenza-recommended annually.  Td/Tdap: Not on file  - Those who have or anticipate having close contact with an infant aged less than 12 months should receive a single dose of Tdap.  - Other adults ages 59 years and older may be given a single dose of Tdap.  Shingles  - Recommended routine vaccination of all persons aged =50 years with 2 doses of herpes zoster vaccine (second dose given 2-6 months after first dose).  Adults Aged  65 years and Older:  Prevnar 20  - Recommends a single dose of PCV 20 for those who have not previously received any pneumococcal vaccine.       Home Safety & Falls:  Safe in current environment: {yes no free text:20080::Yes}   [name]  Feels safe in their current home.    Encouraged to upgrade the following safety items in the home:  1) Guard rails on all stairs, inside and outside  2) Safety bar in bathtub and shower  3) Area rugs are non slip with a secure bottom    Fall Risk Assessment performed: {Fall Risk Performed:310001044::Not at risk for falls.}     Depression Screen:    (Depression Screening Score)  Patient Depression Risk: {APP Depression Risk:310001045}  Current treatment:Parnate    Mental Status:  {Cognitive Impairment:310001047::No concern for cognitive impairment at this time. }    Mini-Cog 07/03/2018   Step 1: Three Word Registration 3   Step 2: Clock Drawing 2 Normal   Step 3: Immediate Word Recall 3 Words Recalled   Mini Cog Score 5                   Based on previous and/or current Medicare Assessments:  Are there any mental health conditions or any such risk factors that have been identified? {Responses; yes/no (default no):140031::No}    End of Life  Planning:       {View ACP Notes:1}    Surrogate Decision Maker:     ACP Surrogate Discussion: ACP or surrogate decision-maker was documented in the medical record    Advance Directive on File: No, Encouraged patient to complete if not completed and return a copy to [place].    Does the code status reflect the patient's wishes? No, Review. This includes full code, advance directive, POLST, and/or POA  Please delete this section if the patient is full code and without any documents on file.    Present for discussion besides patient and provider:      *Reminder: the legal default order for decision making in [place] is: spouse, registered domestic partner, adult children, parent, adult siblings, any adult relative, adult friend. If you want someone to  speak for you who would not be first in the legal default order, filling out an advance directive is important.           Cancer Screen:  Colorectal: IFOB: Not on file. Colonoscopy: 07/24/2016?   {HM Procedure ZOX:096045409}    Lung Cancer Screen: Former; Cigarettes: 1989 - 06/02/2018; 0.50 packs/day for 30.00 years Pack year HX: 15 years..  Does not meet criteria    Breast Cancer Screen: 08/25/2019 Bilateral Mammogram. Recommended repeat 1 year. {MAW HM Procedure Due:27340}    Cervical/Ovarian Cancer Screen: 01/24/2016 Last Pap. Not recommended due to age. There is no good screening test for ovarian cancer. Symptoms of ovarian cancer include: persistent lower abdominal pain/bloating and increased girth.    UI Assessment  Does patient have urinary incontinence? {Yes/No/Unable to Assess:26345}  If yes, is a plan of care in place? {Yes/No/NA:26886}    Osteoporosis Screen:  DXA Scan: 08/05/2018 Normal Recommended repeat in 5-10 years 08/06/2023-2030.  Is currently up to date on screening.   Current treatment:Vitamin D3    Abdominal Aortic Aneurysm Screen:   AAA Ultrasound Screening: Family history? {APP AAA Screen:310001118}    HEP C: 02/28/2018 Negative.      Diabetes and Lipid screening:  Diabetic Labs: Up to date.  Glucose   Date Value Ref Range Status   12/28/2020 98 80 - 99 mg/dL Final      Lab Results   Component Value Date    GLYCOA1C 6.2 (H) 12/28/2020        Lipid Panel:   Up to date.   Lab Results   Component Value Date    CHOL 224 (H) 12/28/2020    CHOL 192 05/07/2019    CHOL 191 02/28/2018     Lab Results   Component Value Date    HDL 45 12/28/2020    HDL 37 (L) 05/07/2019    HDL 38 (L) 02/28/2018     Lab Results   Component Value Date    LDLCALC 133 (H) 12/28/2020    LDLCALC 117 (H) 05/07/2019    LDLCALC 122 (H) 02/28/2018     Lab Results   Component Value Date    TRIG 230 (H) 12/28/2020    TRIG 192 (H) 05/07/2019    TRIG 155 (H) 02/28/2018    Current treatment:Mevacor    The U.S. Task Force on [employer] recommends a lipid panel and diabetic screening every 3 years or more frequently for high risk patients.      Diet and Exercise:  General adult health, diet and exercise recommendation of 3-4 cups of fresh fruits and vegetables and one hour of exercise daily.    In the past 12 months,  did someone from your providers office talk with you about specific goals for your health? {Yes/No:21966}  Is this something you would like to add to your next visit? {Yes/No/NA/Wildcard:24793}    In the past 12 months, did someone from your providers office ask you if there are things that make it hard for you to take care of your health? {Yes/No:21966}  Is this something you would like to add to your next visit? {Yes/No/NA/Wildcard:24793}      Was colorectal cancer screening ordered during this visit? {APP Colonoscopy Ordered:310001098}    Was the patient educated and counseled about appropriate screening and preventive services? {yes no free text:20080::Yes}    Patient instructions (the written plan) will be printed and given to the patient: {yes no free text:20080::Yes}    Annual Wellness Visit portion of this visit completed by Genelle Gather, LPN. Please contact the clinic with any further questions or concerns.            MEDICARE WELLNESS 07/03/2018   During the past 4 weeks, have you been bothered by emotional problems such as feeling anxious, depressed, irritable, sad or downhearted and blue? Not at all   During the past 4 weeks, has your physical and emotional health limited your social activities with family, friends, and/or neighbors? Slightly   During the past 4 weeks, how much bodily pain have you generally had? None   During the past 4 weeks, was someone available to help you if you needed or wanted help? Yes   During the past 4 weeks, what level of physical activity could you perform for at least 2 minutes? Light   If you needed to travel by car, bus, etc. could you travel alone? Yes   Can you go shopping for  groceries or clothes without someone's help? Yes   Can you prepare your own meals? Yes   Can you do housework without help? Yes   Because of any health problems, do you need the help of another person with your personal care needs such as eating, bathing, dressing or getting around the house? No   Can you manage your own money without help? Yes   During the past 4 weeks, how would you rate your health in general? Good   In general, during the past 4 weeks, how have things been going for you? Pretty well   Are you having difficulties driving a vehicle? No   Do you wear a seatbelt when riding in a car? Yes   Have you fallen 2 or more times in the past year? No   Are you afraid of falling? No   Are you a smoker? No, but I am a former smoker   During the past 4 weeks how many drinks of wine, beer or other alcoholic beverages have you had? None   Do you exercise for approximately 20 minutes three or more days a week? No   How often do you have trouble taking medicines the way you have been told to take them? Seldom   How confident are you that you can control and manage most of your health problems? Very confident   Have you been given any information to help you with hazards in your house that might hurt you? Yes   Was handout given to the patient? Yes   Have you been given any information to help you keep track of your medication? Yes   Falling or feeling dizzy when standing up Never   Sexual problems Never  Trouble eating Never   Teeth or denture problems Never   Problems using the telephone or communicating Never   Tiredness or fatigue Never   Preparing food and eating No   Bathing yourself No   Getting dressed No   Using the toilet No   Moving around from place to place No   Was a Fall Risk Screening performed? Yes

## 2021-09-29 NOTE — Telephone Encounter (Signed)
Patient called back and said she did complete her medicare wellness appointment with Tammy. Stated she saw both Dr Rayvon Char and Babette Relic. New eligibility date is 09/28/2022.

## 2021-09-29 NOTE — Telephone Encounter (Signed)
Left message asking [name] to call back to reschedule their Annual Wellness Visit with Tammy due to no show today. Patient is eligible as of 07/05/2019. Please notify me when the patient calls back at [phone]. Thank you.

## 2021-10-07 ENCOUNTER — Other Ambulatory Visit: Payer: Self-pay | Admitting: Family Medicine

## 2021-10-07 DIAGNOSIS — Z1231 Encounter for screening mammogram for malignant neoplasm of breast: Secondary | ICD-10-CM

## 2021-11-09 NOTE — Progress Notes (Unsigned)
   Patient ID: Robin [name], female    DOB: 1951/05/19, 71 y.o.   MRN: [id number]  HPI  Robin [name] is a 71 y.o. female with medical history significant of nicotine dependence, hypertension, hyperlipidemia, anemia, diabetes mellitus.   Echo results reviewed from 07/22/21, EF 85-88%, diastolic parameters consistent with Grade 1 diastolic dysfunction. RV moderately enlarged, LA mildly dilated.   Recently admitted 07/24/21 for acute on chronic respiratory failure, acute on chronic diastolic HF, symptomatic anemia requiring blood transfusions.   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   Review of Systems    Physical Exam    Assessment & Plan:  1: Heart failure with preserved ejection fraction, grade 1 diastolic dysfunction: - NYHA II - euvolemic - has a scale but is not weighing daily; encouraged to begin weighing daily and call for an overnight weight gain of > 2 pounds or a weekly weight gain of >5 pounds - weight 196.4 pounds from last visit here 2 months ago - discussed being highly alert for pedal edema, abdominal swelling and other worsening HF symptoms if she is unable to weigh - adhering to a low-sodium diet - jardiance started at last visit - BNP 07/21/21 was 790.4 - 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

## 2021-11-10 ENCOUNTER — Ambulatory Visit: Payer: Medicare Other | Attending: Family | Admitting: Family

## 2021-11-10 ENCOUNTER — Encounter: Payer: Self-pay | Admitting: Family

## 2021-11-10 VITALS — BP 115/54 | HR 51 | Resp 20 | Ht 59.0 in | Wt 196.0 lb

## 2021-11-10 DIAGNOSIS — E785 Hyperlipidemia, unspecified: Secondary | ICD-10-CM | POA: Diagnosis not present

## 2021-11-10 DIAGNOSIS — E119 Type 2 diabetes mellitus without complications: Secondary | ICD-10-CM | POA: Insufficient documentation

## 2021-11-10 DIAGNOSIS — I10 Essential (primary) hypertension: Secondary | ICD-10-CM | POA: Diagnosis not present

## 2021-11-10 DIAGNOSIS — Z87891 Personal history of nicotine dependence: Secondary | ICD-10-CM | POA: Diagnosis not present

## 2021-11-10 DIAGNOSIS — Z7984 Long term (current) use of oral hypoglycemic drugs: Secondary | ICD-10-CM | POA: Insufficient documentation

## 2021-11-10 DIAGNOSIS — E1169 Type 2 diabetes mellitus with other specified complication: Secondary | ICD-10-CM

## 2021-11-10 DIAGNOSIS — I110 Hypertensive heart disease with heart failure: Secondary | ICD-10-CM | POA: Insufficient documentation

## 2021-11-10 DIAGNOSIS — I5032 Chronic diastolic (congestive) heart failure: Secondary | ICD-10-CM | POA: Diagnosis not present

## 2021-11-10 DIAGNOSIS — I5030 Unspecified diastolic (congestive) heart failure: Secondary | ICD-10-CM | POA: Insufficient documentation

## 2021-11-10 DIAGNOSIS — D649 Anemia, unspecified: Secondary | ICD-10-CM | POA: Insufficient documentation

## 2021-11-10 NOTE — Patient Instructions (Addendum)
Begin weighing daily and call for an overnight weight gain of 3 pounds or more or a weekly weight gain of more than 5 pounds.   If you have voicemail, please make sure your mailbox is cleaned out so that we may leave a message and please make sure to listen to any voicemails.     

## 2021-11-11 ENCOUNTER — Other Ambulatory Visit: Payer: Self-pay

## 2021-11-11 DIAGNOSIS — Z87891 Personal history of nicotine dependence: Secondary | ICD-10-CM

## 2021-11-11 DIAGNOSIS — Z122 Encounter for screening for malignant neoplasm of respiratory organs: Secondary | ICD-10-CM

## 2021-11-22 ENCOUNTER — Ambulatory Visit: Admission: RE | Admit: 2021-11-22 | Payer: Medicare Other | Source: Ambulatory Visit

## 2021-12-05 ENCOUNTER — Encounter: Payer: Self-pay | Admitting: Family Medicine

## 2021-12-08 ENCOUNTER — Ambulatory Visit
Admission: RE | Admit: 2021-12-08 | Discharge: 2021-12-08 | Disposition: A | Discharge status: Home / Self Care | Payer: Medicare Other | Source: Ambulatory Visit | Attending: Family Medicine | Admitting: Family Medicine

## 2021-12-08 DIAGNOSIS — Z1231 Encounter for screening mammogram for malignant neoplasm of breast: Secondary | ICD-10-CM | POA: Diagnosis present

## 2021-12-22 ENCOUNTER — Other Ambulatory Visit: Payer: Self-pay

## 2021-12-23 NOTE — Telephone Encounter (Signed)
Patient would like to reschedule Medicare Wellness visit

## 2021-12-26 NOTE — Telephone Encounter (Signed)
Patient was mistaken and actually didn't see the [facility] nurse on 09/26/21. Eligibility is still 07/05/2019.    Annual Wellness visit rescheduled with [facility] nurse on 01/04/2022 @ 1:00 p.m. elig. 07/05/2019.

## 2021-12-27 ENCOUNTER — Encounter: Payer: Self-pay | Admitting: Gastroenterology

## 2021-12-27 ENCOUNTER — Ambulatory Visit (INDEPENDENT_AMBULATORY_CARE_PROVIDER_SITE_OTHER): Payer: Medicare Other | Admitting: Gastroenterology

## 2021-12-27 ENCOUNTER — Other Ambulatory Visit: Payer: Self-pay

## 2021-12-27 VITALS — BP 140/73 | HR 68 | Temp 97.5°F | Ht 59.0 in | Wt 196.5 lb

## 2021-12-27 DIAGNOSIS — R195 Other fecal abnormalities: Secondary | ICD-10-CM | POA: Diagnosis not present

## 2021-12-27 DIAGNOSIS — R7989 Other specified abnormal findings of blood chemistry: Secondary | ICD-10-CM | POA: Diagnosis not present

## 2021-12-27 DIAGNOSIS — D509 Iron deficiency anemia, unspecified: Secondary | ICD-10-CM

## 2021-12-27 MED ORDER — NA SULFATE-K SULFATE-MG SULF 17.5-3.13-1.6 GM/177ML PO SOLN: 354.0000 mL | Freq: Once | ORAL | 0 refills | Status: AC

## 2021-12-27 NOTE — Progress Notes (Addendum)
Cephas Darby, MD [address]  [place]  [place], [place] [address]  Main: [phone]  Fax: [phone]    Gastroenterology Consultation  Referring Provider:     Letta Median, MD Primary Care Physician:  Donnie Coffin, MD Primary Gastroenterologist:  Dr. Cephas Darby Reason for Consultation:  Iron deficiency anemia, FOBT positive        HPI:   Robin [name] is a 71 y.o. female referred by Dr. Donnie Coffin, MD  for consultation & management of history of severe iron deficiency anemia.  Patient was admitted to [facility] beginning of February 2023 secondary to severe symptomatic anemia with no evidence of active GI bleed.  Her hemoglobin was 6.3, received 2 units of PRBCs, responded appropriately to 9.4.  GI was consulted at that time who have discussed about undergoing upper endoscopy as well as colonoscopy as inpatient or an outpatient.  Patient decided to undergo procedures as outpatient.  Patient had mild diastolic heart failure, responded to diuretics, EF was 60 to 65%.  Patient has history of metabolic syndrome.  Today, patient denies any GI symptoms.  She does report seeing occasional blood on wiping.  Her most recent hemoglobin in April was 11.9, MCV 69.  She denies any chest pain, shortness of breath  NSAIDs: None  Antiplts/Anticoagulants/Anti thrombotics: None  GI Procedures: None She denies family history of GI malignancy  Past Medical History:  Diagnosis Date   Anemia    Cellulitis    CHF (congestive heart failure) (HCC)    Diabetes mellitus without complication (HCC)    High cholesterol    Hypertension     History reviewed. No pertinent surgical history.   Current Outpatient Medications:    albuterol (PROVENTIL HFA;VENTOLIN HFA) 108 (90 Base) MCG/ACT inhaler, Inhale 2 puffs into the lungs every 4 (four) hours as needed for wheezing or shortness of breath., Disp: 1 Inhaler, Rfl: 1   atorvastatin (LIPITOR) 20 MG tablet, Take 20 mg by mouth  daily., Disp: , Rfl:    empagliflozin (JARDIANCE) 10 MG TABS tablet, Take 1 tablet (10 mg total) by mouth daily before breakfast., Disp: 30 tablet, Rfl: 5   feeding supplement (ENSURE ENLIVE / ENSURE PLUS) LIQD, Take 237 mLs by mouth 2 (two) times daily between meals., Disp: 14220 mL, Rfl: 0   furosemide (LASIX) 40 MG tablet, Take 1 tablet (40 mg total) by mouth 2 (two) times daily. (Patient taking differently: Take 40 mg by mouth daily.), Disp: 60 tablet, Rfl: 0   magnesium oxide (MAG-OX) 400 MG tablet, Take 1 tablet by mouth 2 (two) times daily., Disp: , Rfl:    metFORMIN (GLUCOPHAGE-XR) 500 MG 24 hr tablet, Take 500 mg by mouth 2 (two) times daily., Disp: , Rfl:    metoprolol succinate (TOPROL XL) 25 MG 24 hr tablet, Take 0.5 tablets (12.5 mg total) by mouth at bedtime., Disp: 15 tablet, Rfl: 5   mometasone-formoterol (DULERA) 100-5 MCG/ACT AERO, Inhale 2 puffs into the lungs 2 (two) times daily., Disp: 1 each, Rfl: 0   potassium chloride SA (KLOR-CON M) 20 MEQ tablet, Take 1 tablet (20 mEq total) by mouth 2 (two) times daily. (Patient taking differently: Take 20 mEq by mouth daily.), Disp: 60 tablet, Rfl: 0   Family History  Problem Relation Age of Onset   Breast cancer Sister 62     Social History   Tobacco Use   Smoking status: Former    Packs/day: 1.00    Years:  47.00    Total pack years: 47.00    Types: Cigarettes   Smokeless tobacco: Never  Substance Use Topics   Alcohol use: Yes    Comment: rare   Drug use: No    Allergies as of 12/27/2021   (No Known Allergies)    Review of Systems:    All systems reviewed and negative except where noted in HPI.   Physical Exam:  BP 140/73 (BP Location: Left Arm, Patient Position: Sitting, Cuff Size: Normal)   Pulse 68   Temp (!) 97.5 F (36.4 C) (Oral)   Ht '4\' 11"'$  (1.499 m)   Wt 196 lb 8 oz (89.1 kg)   BMI 39.69 kg/m  No LMP recorded. Patient is postmenopausal.  General:   Alert,  Well-developed, well-nourished, pleasant  and cooperative in NAD Head:  Normocephalic and atraumatic. Eyes:  Sclera clear, no icterus.   Conjunctiva pink. Ears:  Normal auditory acuity. Nose:  No deformity, discharge, or lesions. Mouth:  No deformity or lesions,oropharynx pink & moist. Neck:  Supple; no masses or thyromegaly. Lungs:  Respirations even and unlabored.  Clear throughout to auscultation.   No wheezes, crackles, or rhonchi. No acute distress. Heart:  Regular rate and rhythm; no murmurs, clicks, rubs, or gallops. Abdomen:  Normal bowel sounds. Soft, non-tender and non-distended without masses, hepatosplenomegaly or hernias noted.  No guarding or rebound tenderness.   Rectal: Not performed Msk:  Symmetrical without gross deformities. Good, equal movement & strength bilaterally. Pulses:  Normal pulses noted. Extremities:  No clubbing or edema.  No cyanosis. Neurologic:  Alert and oriented x3;  grossly normal neurologically. Skin:  Intact without significant lesions or rashes. No jaundice. Psych:  Alert and cooperative. Normal mood and affect.  Imaging Studies: No abdominal imaging  Assessment and Plan:   Robin [name] is a 71 y.o.-year-old pleasant African-American female with history of metabolic syndrome, history of severe iron deficiency anemia, intermittent bright red blood per rectum  Severe iron deficiency anemia, FOBT positive Recommend upper endoscopy as well as colonoscopy +/- VCE for further evaluation Recommend check CBC, iron panel, B12 and folate levels  Elevated LFTs AST and ALT mildly elevated, likely secondary to fatty liver in setting of metabolic syndrome Recheck LFTs today, check acute viral hepatitis panel Recommend right upper quadrant ultrasound   Follow up in 63month   RCephas Darby MD

## 2021-12-27 NOTE — Addendum Note (Signed)
Addended by: Ulyess Blossom L on: 12/27/2021 02:49 PM   Modules accepted: Orders

## 2021-12-27 NOTE — Patient Instructions (Signed)
Your RUQ ultrasound schedule for 01/02/2022 arrive to [facility] surgery center at 9:15am for a 9:30am scan. Nothing to eat or drink after midnight

## 2021-12-28 ENCOUNTER — Telehealth: Payer: Self-pay

## 2021-12-28 LAB — HCV INTERPRETATION

## 2021-12-28 LAB — HEPATIC FUNCTION PANEL
ALT: 18 IU/L (ref 0–32)
AST: 19 IU/L (ref 0–40)
Albumin: 3.8 g/dL — ABNORMAL LOW (ref 3.9–4.9)
Alkaline Phosphatase: 109 IU/L (ref 44–121)
Bilirubin Total: 0.5 mg/dL (ref 0.0–1.2)
Bilirubin, Direct: 0.15 mg/dL (ref 0.00–0.40)
Total Protein: 7.3 g/dL (ref 6.0–8.5)

## 2021-12-28 LAB — IRON,TIBC AND FERRITIN PANEL
Ferritin: 15 ng/mL (ref 15–150)
Iron Saturation: 5 % — CL (ref 15–55)
Iron: 22 ug/dL — ABNORMAL LOW (ref 27–139)
Total Iron Binding Capacity: 404 ug/dL (ref 250–450)
UIBC: 382 ug/dL — ABNORMAL HIGH (ref 118–369)

## 2021-12-28 LAB — CBC
Hematocrit: 37.2 % (ref 34.0–46.6)
Hemoglobin: 10.8 g/dL — ABNORMAL LOW (ref 11.1–15.9)
MCH: 18.5 pg — ABNORMAL LOW (ref 26.6–33.0)
MCHC: 29 g/dL — ABNORMAL LOW (ref 31.5–35.7)
MCV: 64 fL — ABNORMAL LOW (ref 79–97)
Platelets: 471 10*3/uL — ABNORMAL HIGH (ref 150–450)
RBC: 5.85 x10E6/uL — ABNORMAL HIGH (ref 3.77–5.28)
RDW: 20.5 % — ABNORMAL HIGH (ref 11.7–15.4)
WBC: 9 10*3/uL (ref 3.4–10.8)

## 2021-12-28 LAB — ACUTE VIRAL HEPATITIS (HAV, HBV, HCV)
HCV Ab: NONREACTIVE
Hep A IgM: NEGATIVE
Hep B C IgM: NEGATIVE
Hepatitis B Surface Ag: NEGATIVE

## 2021-12-28 LAB — B12 AND FOLATE PANEL
Folate: 9.8 ng/mL (ref >3.0)
Vitamin B-12: 691 pg/mL (ref 232–1245)

## 2021-12-28 MED ORDER — FUSION PLUS PO CAPS: 1.0000 | ORAL_CAPSULE | Freq: Every day | ORAL | 4 refills | Status: DC

## 2021-12-28 NOTE — Telephone Encounter (Signed)
Patient verbalized understanding of results  

## 2021-12-28 NOTE — Telephone Encounter (Signed)
Called patient and left a message for call back. Sent medication to the pharmacy

## 2021-12-28 NOTE — Telephone Encounter (Signed)
-----   Message from Lin Landsman, MD sent at 12/28/2021  9:29 AM EDT ----- Severe iron deficiency anemia, recommend to start fusion plus daily for 3 months  RV

## 2022-01-02 ENCOUNTER — Ambulatory Visit
Admission: RE | Admit: 2022-01-02 | Discharge: 2022-01-02 | Disposition: A | Discharge status: Home / Self Care | Payer: Medicare Other | Source: Ambulatory Visit | Attending: Gastroenterology | Admitting: Gastroenterology

## 2022-01-02 DIAGNOSIS — R7989 Other specified abnormal findings of blood chemistry: Secondary | ICD-10-CM | POA: Insufficient documentation

## 2022-01-02 NOTE — Telephone Encounter (Signed)
-----   Message from Starla Link, [place] sent at 12/31/2020  1:36 PM PDT -----  Yearly labs

## 2022-01-02 NOTE — Telephone Encounter (Signed)
Spoke with patient, patient aware of they are due for annual fasting labs, order in epic.

## 2022-01-03 ENCOUNTER — Telehealth: Payer: Self-pay

## 2022-01-03 NOTE — Telephone Encounter (Signed)
Patient verbalized understanding results

## 2022-01-03 NOTE — Telephone Encounter (Signed)
-----   Message from Lin Landsman, MD sent at 01/03/2022  8:28 AM EDT ----- Normal liver based on [place]  RV

## 2022-01-04 ENCOUNTER — Ambulatory Visit: Admit: 2022-01-04 | Discharge: 2022-01-09 | Payer: MEDICARE | Primary: DO

## 2022-01-04 ENCOUNTER — Other Ambulatory Visit: Admit: 2022-01-04 | Discharge: 2022-01-04 | Payer: MEDICARE | Primary: FNP

## 2022-01-04 DIAGNOSIS — R739 Hyperglycemia, unspecified: Secondary | ICD-10-CM

## 2022-01-04 DIAGNOSIS — Z0000 Encounter for general adult medical examination without abnormal findings: Secondary | ICD-10-CM

## 2022-01-04 LAB — COMPREHENSIVE METABOLIC PANEL
ALT - Alanine Aminotransferase: 15 IU/L (ref 7–52)
AST - Aspartate Aminotransferase: 20 IU/L (ref 8–39)
Albumin/Globulin Ratio: 1.9 (ref >0.9)
Albumin: 4.5 g/dL (ref 3.5–5.0)
Alkaline Phosphatase: 73 IU/L (ref 34–104)
Anion Gap: 8 mmol/L (ref 4–13)
BUN: 9 mg/dL (ref 6–23)
Bilirubin Total: 0.7 mg/dL (ref 0.3–1.2)
CO2 - Carbon Dioxide: 27 mmol/L (ref 21–31)
Calcium: 9.3 mg/dL (ref 8.6–10.3)
Chloride: 108 mmol/L (ref 98–111)
Creatinine: 0.73 mg/dL (ref 0.55–1.10)
Globulin: 2.4 g/dL (ref 2.0–3.7)
Glomerular Filtration Rate Estimate (Female): >60 mL/min/{1.73_m2} (ref >=60)
Glucose: 82 mg/dL (ref 80–99)
Potassium: 4.2 mmol/L (ref 3.5–5.1)
Protein Total: 6.9 g/dL (ref 6.0–8.0)
Sodium: 143 mmol/L (ref 135–143)

## 2022-01-04 LAB — GLYCO-HEMOGLOBIN A1C
Estimated Average Glucose: 128 mg/dL
Glycohemoglobin (A1c): 6.1 % (ref 4.3–6.1)

## 2022-01-04 LAB — CBC WITH AUTO DIFFERENTIAL
Basophils %: 1 % (ref 0–2)
Basophils, Absolute: 0 10*3/ÂµL (ref 0.0–0.2)
Eosinophils %: 4 % (ref 0–7)
Eosinophils, Absolute: 0.2 10*3/ÂµL (ref 0.0–0.7)
HCT: 39.3 % (ref 37.0–48.0)
Hemoglobin: 13.2 g/dL (ref 12.0–16.0)
Lymphocytes %: 38 % (ref 25–45)
Lymphocytes, Absolute: 1.9 10*3/ÂµL (ref 1.1–4.3)
MCH: 28.6 pg (ref 27.0–34.0)
MCHC: 33.5 g/dL (ref 32.0–36.0)
MCV: 85.3 fL (ref 81.0–99.0)
MPV: 7.9 fL (ref 7.4–10.4)
Monocytes %: 9 % (ref 0–12)
Monocytes, Absolute: 0.4 10*3/ÂµL (ref 0.0–1.2)
Neutrophils %: 48 % (ref 35–70)
Neutrophils, Absolute: 2.5 10*3/ÂµL (ref 1.6–7.3)
Platelet Count: 177 10*3/ÂµL (ref 150–400)
RBC: 4.61 10*6/ÂµL (ref 4.20–5.40)
RDW: 13.8 % (ref 11.5–14.5)
WBC: 5.1 10*3/ÂµL (ref 4.8–10.8)

## 2022-01-04 LAB — T4, FREE: T4, Free: 2 ng/dL — ABNORMAL HIGH (ref 0.6–1.2)

## 2022-01-04 LAB — 25-OH HYDROXY VITAMIN D, CALCIFEROL, TOTAL OF D2 & D3: Vitamin D 25-OH Total: 38.1 ng/mL (ref 30.0–100.0)

## 2022-01-04 LAB — CORONARY RISK LIPID PANEL
Cholesterol, HDL: 50 mg/dL (ref >=40)
Cholesterol: 197 mg/dL (ref <200)
LDL Calculated: 124 mg/dL — ABNORMAL HIGH (ref <100)
Triglyceride: 114 mg/dL (ref 30–149)

## 2022-01-04 LAB — TSH: TSH - Thyroid Stimulating Hormone: 2.74 ÂµIU/mL (ref 0.45–5.33)

## 2022-01-04 NOTE — Assessment & Plan Note (Signed)
Subjective:     [name] is a 71 y.o. female who presents for a subsequent Medicare Annual Wellness visit    List of Healthcare Providers and/or Medical suppliers involved in patient's care:    Patient Care Team:  Carmell Austria, DO as PCP - General (Family Medicine)    Review of Histories:  The following portions of the patient's history were reviewed and updated as appropriate: allergies, current medications, past family history, past medical history, past social history, past surgical history and problem list.    Review of Systems   Eyes: Negative for blurred vision.   Respiratory: Negative for shortness of breath.    Cardiovascular: Negative for chest pain.   Neurological: Negative for dizziness.       PHQ-9 Total: 0              Personalized Written Plan:    Last OV: 09/26/2021 with PCP.     Follow up/Additional:Next Visit:  1) Tdap (tetanus) vaccine, Referred to [facility]CP, [facility] or [facility].    2) Shingles vaccine series -starting 2023 Medicare is covering a portion or all of the cost of the vaccine depending on your insurance plan. You may have the pharmacy run your insurance card to inquire about your insurance coverage or you may call your insurance company.Referred to [facility]CP, [facility] or Pharmacy.    3) Advance Directive -Please complete, make a copy and mail back to office or bring to office to be scanned into your chart:    [facility]  [address]  [place], [place]  [address]    4) Mammogram active order,  call to schedule your appointment [phone] 1 or [phone].    [name] denies any additional questions or concerns at this visit.  [name] is encouraged to call with any further questions or concerns.    Orders Placed During Visit:  No orders of the defined types were placed in this encounter.        Vitals:  Blood pressure 124/70, pulse 71, height 5' 2.5 (1.588 m), weight 163 lb 9.6 oz (74.2 kg), SpO2 97 %, not currently breastfeeding.  Body mass index is 29.45  kg/m?[name]    Vaccines:  Immunization History   Administered Date(s) Administered   . FLU 65+yrs QUADRIVALENT ADJUVANTED INJ(PF) 04/05/2020, 06/21/2021   . Flu 61mo-45yrs Quadrivalent Inj(PF) 05/09/2017   . Flu High Dose (PF) 03/21/2018, 05/09/2019   . Pfizer 12 yr+ SARS-CoV-2 monovalent vaccine (PURPLE cap) 10/17/2019, 11/07/2019, 05/21/2020   . Pneumococcal Conjugate 13-Valent 03/21/2018   . Pneumococcal Polysaccharide 05/09/2019        The CDC and ACIP:  Influenza-recommended annually.  Td/Tdap: Not on file  - Those who have or anticipate having close contact with an infant aged less than 12 months should receive a single dose of Tdap.  - Other adults ages 82 years and older may be given a single dose of Tdap.  Shingles  - Recommended routine vaccination of all persons aged ?50 years with 2 doses of herpes zoster vaccine (second dose given 2-6 months after first dose).  Adults Aged 65 years and Older:  Prevnar 20  - Recommends a single dose of PCV 20 for those who have not previously received any pneumococcal vaccine.       Home Safety & Falls:  Safe in current environment: Yes   [name]  Feels safe in their current home.    Encouraged to upgrade the following safety items in the home:  1) Guard rails on all stairs, inside and outside  2) Safety bar in bathtub and shower  3) Area rugs are non slip with a secure bottom    Fall Risk Assessment performed: Not at risk for falls.     Depression Screen:  PHQ-9 Total: 0 (Depression Screening Score)  Patient Depression Risk: Is not currently at risk. and Is under current treatment.  Current treatment:Tranylcypromine, working well at this time.     Mental Status:  No concern for cognitive impairment at this time.     Mini-Cog 01/04/2022 07/03/2018   Step 1: Three Word Registration 1 3   Step 2: Clock Drawing 2 Normal 2 Normal   Step 3: Immediate Word Recall 3 Words Recalled 3 Words Recalled   Mini Cog Score 5 5                   Based on previous and/or current Medicare  Assessments:  Are there any mental health conditions or any such risk factors that have been identified? No    End of Life Planning:          Surrogate Decision MakerByrnece, [name]  Chart Review [phone]    ACP Surrogate Discussion: ACP or surrogate decision-maker was documented in the medical record    Advance Directive on File: No, Encouraged patient to complete if not completed and return a copy to [place].    Does the code status reflect the patient's wishes? No, Review This includes full code, advance directive, POLST, and/or POA  Please delete this section if the patient is full code and without any documents on file.    Present for discussion besides patient and provider: [name]      *Reminder: the legal default order for decision making in [place] is: spouse, registered domestic partner, adult children, parent, adult siblings, any adult relative, adult friend. If you want someone to speak for you who would not be first in the legal default order, filling out an advance directive is important.          Cancer Screen:  Colorectal: IFOB: Not on file.  Colonoscopy:  Not on file. Patient has an active order in place.  waiting to schedule colonoscopy    Lung Cancer Screen: Former; Cigarettes: 1989 - 06/02/2018; 0.50 packs/day for 30.00 years Pack year HX: 15 years..  Does not meet criteria    Breast Cancer Screen: 08/25/2019 BIlateral Mammogram. Recommended repeat every year. Patient has an active order in place.     Cervical/Ovarian Cancer Screen: Not on file.  Not recommended due to age. There is no good screening test for ovarian cancer. Symptoms of ovarian cancer include: persistent lower abdominal pain/bloating and vaginal bleeding.    UI Assessment  Does patient have urinary incontinence? No  If yes, is a plan of care in place? No    Osteoporosis Screen:  DXA Scan: 08/05/2018 Normal bone density. Recommended repeat in 5 years 08/06/2023.  Is currently up to date on screening.    Current  treatment:Vitamin D3    Abdominal Aortic Aneurysm Screen:   AAA Ultrasound Screening:  Does not meet criteria    HEP C: 02/28/2018 Negative      Diabetes and Lipid screening:  Diabetic Labs: Up to date.  Glucose   Date Value Ref Range Status   12/28/2020 98 80 - 99 mg/dL Final      Lab Results   Component Value Date    GLYCOA1C 6.2 (H) 12/28/2020  Lipid Panel:   Up to date.   Lab Results   Component Value Date    CHOL 224 (H) 12/28/2020    CHOL 192 05/07/2019    CHOL 191 02/28/2018     Lab Results   Component Value Date    HDL 45 12/28/2020    HDL 37 (L) 05/07/2019    HDL 38 (L) 02/28/2018     Lab Results   Component Value Date    LDLCALC 133 (H) 12/28/2020    LDLCALC 117 (H) 05/07/2019    LDLCALC 122 (H) 02/28/2018     Lab Results   Component Value Date    TRIG 230 (H) 12/28/2020    TRIG 192 (H) 05/07/2019    TRIG 155 (H) 02/28/2018    Current treatment:Mevacor    The U.S. Task Force on [employer] recommends a lipid panel and diabetic screening every 3 years or more frequently for high risk patients.      Diet and Exercise:  General adult health, diet and exercise recommendation of 3-4 cups of fresh fruits and vegetables and one hour of exercise daily.    In the past 12 months, did someone from your providers office talk with you about specific goals for your health? Yes  Is this something you would like to add to your next visit? No    In the past 12 months, did someone from your providers office ask you if there are things that make it hard for you to take care of your health? Yes  Is this something you would like to add to your next visit? No      Was colorectal cancer screening ordered during this visit? No    Was the patient educated and counseled about appropriate screening and preventive services? Yes    Patient instructions (the written plan) will be printed and given to the patient: Yes    Annual Wellness Visit portion of this visit completed by Genelle Gather, LPN. Please contact the clinic  with any further questions or concerns.

## 2022-01-04 NOTE — Progress Notes (Signed)
Assessment      Subjective:     [name] is a 71 y.o. female who presents for a subsequent Medicare Annual Wellness visit    List of Healthcare Providers and/or Medical suppliers involved in patient's care:    Patient Care Team:  Carmell Austria, DO as PCP - General (Family Medicine)    Review of Histories:  The following portions of the patient's history were reviewed and updated as appropriate: allergies, current medications, past family history, past medical history, past social history, past surgical history and problem list.    Review of Systems   Eyes: Negative for blurred vision.   Respiratory: Negative for shortness of breath.    Cardiovascular: Negative for chest pain.   Neurological: Negative for dizziness.       PHQ-9 Total: 0               Personalized Written Plan:    Last OV: 09/26/2021 with PCP.     Follow up/Additional:Next Visit:  1) Tdap (tetanus) vaccine, Referred to PCP, [facility] or [facility]acy.    2) Shingles vaccine series -starting 2023 Medicare is covering a portion or all of the cost of the vaccine depending on your insurance plan. You may have the pharmacy run your insurance card to inquire about your insurance coverage or you may call your insurance company.Referred to PCP, [facility] or Pharmacy.    3) Advance Directive -Please complete, make a copy and mail back to office or bring to office to be scanned into your chart:    [facility]  [address]  [place], [place]  [address]    4) Mammogram active order,  call to schedule your appointment [phone] 1 or [phone].    Keiana denies any additional questions or concerns at this visit.  Anntoinette is encouraged to call with any further questions or concerns.    Orders Placed During Visit:  No orders of the defined types were placed in this encounter.        Vitals:  Blood pressure 124/70, pulse 71, height 5' 2.5 (1.588 m), weight 163 lb 9.6 oz (74.2 kg), SpO2 97 %, not currently breastfeeding.  Body mass index is 29.45  kg/m?[name]    Vaccines:  Immunization History   Administered Date(s) Administered   . FLU 65+yrs QUADRIVALENT ADJUVANTED INJ(PF) 04/05/2020, 06/21/2021   . Flu 88mo-4yrs Quadrivalent Inj(PF) 05/09/2017   . Flu High Dose (PF) 03/21/2018, 05/09/2019   . Pfizer 12 yr+ SARS-CoV-2 monovalent vaccine (PURPLE cap) 10/17/2019, 11/07/2019, 05/21/2020   . Pneumococcal Conjugate 13-Valent 03/21/2018   . Pneumococcal Polysaccharide 05/09/2019        The CDC and ACIP:  Influenza-recommended annually.  Td/Tdap: Not on file  - Those who have or anticipate having close contact with an infant aged less than 12 months should receive a single dose of Tdap.  - Other adults ages 28 years and older may be given a single dose of Tdap.  Shingles  - Recommended routine vaccination of all persons aged =50 years with 2 doses of herpes zoster vaccine (second dose given 2-6 months after first dose).  Adults Aged 65 years and Older:  Prevnar 20  - Recommends a single dose of PCV 20 for those who have not previously received any pneumococcal vaccine.       Home Safety & Falls:  Safe in current environment: Yes   [name]  Feels safe in their current home.    Encouraged to upgrade the  following safety items in the home:  1) Guard rails on all stairs, inside and outside  2) Safety bar in bathtub and shower  3) Area rugs are non slip with a secure bottom    Fall Risk Assessment performed: Not at risk for falls.     Depression Screen:  PHQ-9 Total: 0 (Depression Screening Score)  Patient Depression Risk: Is not currently at risk. and Is under current treatment.  Current treatment:Tranylcypromine, working well at this time.     Mental Status:  No concern for cognitive impairment at this time.     Mini-Cog 01/04/2022 07/03/2018   Step 1: Three Word Registration 1 3   Step 2: Clock Drawing 2 Normal 2 Normal   Step 3: Immediate Word Recall 3 Words Recalled 3 Words Recalled   Mini Cog Score 5 5                   Based on previous and/or current Medicare  Assessments:  Are there any mental health conditions or any such risk factors that have been identified? No    End of Life Planning:           Surrogate Decision MakerElnor, [name]  Chart Review [phone]    ACP Surrogate Discussion: ACP or surrogate decision-maker was documented in the medical record    Advance Directive on File: No, Encouraged patient to complete if not completed and return a copy to [place].    Does the code status reflect the patient's wishes? No, Review This includes full code, advance directive, POLST, and/or POA  Please delete this section if the patient is full code and without any documents on file.    Present for discussion besides patient and provider: [name]      *Reminder: the legal default order for decision making in [place] is: spouse, registered domestic partner, adult children, parent, adult siblings, any adult relative, adult friend. If you want someone to speak for you who would not be first in the legal default order, filling out an advance directive is important.           Cancer Screen:  Colorectal: IFOB: Not on file.  Colonoscopy:  Not on file. Patient has an active order in place.  waiting to schedule colonoscopy    Lung Cancer Screen: Former; Cigarettes: 1989 - 06/02/2018; 0.50 packs/day for 30.00 years Pack year HX: 15 years..  Does not meet criteria    Breast Cancer Screen: 08/25/2019 BIlateral Mammogram. Recommended repeat every year. Patient has an active order in place.     Cervical/Ovarian Cancer Screen: Not on file.  Not recommended due to age. There is no good screening test for ovarian cancer. Symptoms of ovarian cancer include: persistent lower abdominal pain/bloating and vaginal bleeding.    UI Assessment  Does patient have urinary incontinence? No  If yes, is a plan of care in place? No    Osteoporosis Screen:  DXA Scan: 08/05/2018 Normal bone density. Recommended repeat in 5 years 08/06/2023.  Is currently up to date on screening.    Current  treatment:Vitamin D3    Abdominal Aortic Aneurysm Screen:   AAA Ultrasound Screening:  Does not meet criteria    HEP C: 02/28/2018 Negative      Diabetes and Lipid screening:  Diabetic Labs: Up to date.  Glucose   Date Value Ref Range Status   12/28/2020 98 80 - 99 mg/dL Final      Lab Results   Component Value Date  GLYCOA1C 6.2 (H) 12/28/2020        Lipid Panel:   Up to date.   Lab Results   Component Value Date    CHOL 224 (H) 12/28/2020    CHOL 192 05/07/2019    CHOL 191 02/28/2018     Lab Results   Component Value Date    HDL 45 12/28/2020    HDL 37 (L) 05/07/2019    HDL 38 (L) 02/28/2018     Lab Results   Component Value Date    LDLCALC 133 (H) 12/28/2020    LDLCALC 117 (H) 05/07/2019    LDLCALC 122 (H) 02/28/2018     Lab Results   Component Value Date    TRIG 230 (H) 12/28/2020    TRIG 192 (H) 05/07/2019    TRIG 155 (H) 02/28/2018    Current treatment:Mevacor    The U.S. Task Force on [employer] recommends a lipid panel and diabetic screening every 3 years or more frequently for high risk patients.      Diet and Exercise:  General adult health, diet and exercise recommendation of 3-4 cups of fresh fruits and vegetables and one hour of exercise daily.    In the past 12 months, did someone from your providers office talk with you about specific goals for your health? Yes  Is this something you would like to add to your next visit? No    In the past 12 months, did someone from your providers office ask you if there are things that make it hard for you to take care of your health? Yes  Is this something you would like to add to your next visit? No      Was colorectal cancer screening ordered during this visit? No    Was the patient educated and counseled about appropriate screening and preventive services? Yes    Patient instructions (the written plan) will be printed and given to the patient: Yes    Annual Wellness Visit portion of this visit completed by Genelle Gather, LPN. Please contact the clinic  with any further questions or concerns.            MEDICARE WELLNESS 01/04/2022 01/04/2022 09/26/2021 07/03/2018   During the past 4 weeks, have you been bothered by emotional problems such as feeling anxious, depressed, irritable, sad or downhearted and blue? - Not at all - Not at all   During the past 4 weeks, has your physical and emotional health limited your social activities with family, friends, and/or neighbors? - Not at all - Slightly   During the past 4 weeks, how much bodily pain have you generally had? - None - None   During the past 4 weeks, was someone available to help you if you needed or wanted help? - Yes - Yes   During the past 4 weeks, what level of physical activity could you perform for at least 2 minutes? - Heavy - Light   If you needed to travel by car, bus, etc. could you travel alone? - Yes - Yes   Can you go shopping for groceries or clothes without someone's help? - Yes - Yes   Can you prepare your own meals? - Yes - Yes   Can you do housework without help? - Yes - Yes   Because of any health problems, do you need the help of another person with your personal care needs such as eating, bathing, dressing or getting around the house? - No - No  Can you manage your own money without help? - Yes - Yes   During the past 4 weeks, how would you rate your health in general? - Good - Good   In general, during the past 4 weeks, how have things been going for you? - Pretty well - Pretty well   Are you having difficulties driving a vehicle? - No - No   Do you wear a seatbelt when riding in a car? - Yes - Yes   Have you fallen 2 or more times in the past year? No No No No   Are you afraid of falling? - No - No   Are you a smoker? - No, but I am a former smoker - No, but I am a former smoker   During the past 4 weeks how many drinks of wine, beer or other alcoholic beverages have you had? - None - None   Do you exercise for approximately 20 minutes three or more days a week? - Yes, most of the time - No    How often do you have trouble taking medicines the way you have been told to take them? - (No Data) - Seldom   How confident are you that you can control and manage most of your health problems? - Very confident - Very confident   Have you been given any information to help you with hazards in your house that might hurt you? - Yes - Yes   Was handout given to the patient? - Yes - Yes   Have you been given any information to help you keep track of your medication? - Yes - Yes   Falling or feeling dizzy when standing up - Never - Never   Sexual problems - Never - Never   Trouble eating - Never - Never   Teeth or denture problems - Never - Never   Problems using the telephone or communicating - Never - Never   Tiredness or fatigue - Never - Never   Preparing food and eating - No - No   Bathing yourself - No - No   Getting dressed - No - No   Using the toilet - No - No   Moving around from place to place - No - No   Was a Fall Risk Screening performed? - Yes - Yes

## 2022-01-04 NOTE — Patient Instructions (Signed)
Subjective:     [name] is a 71 y.o. female who presents for a subsequent Medicare Annual Wellness visit    List of Healthcare Providers and/or Medical suppliers involved in patient's care:    Patient Care Team:  Douglas B McMahon, DO as PCP - General (Family Medicine)    Review of Histories:  The following portions of the patient's history were reviewed and updated as appropriate: allergies, current medications, past family history, past medical history, past social history, past surgical history and problem list.    Review of Systems   Eyes: Negative for blurred vision.   Respiratory: Negative for shortness of breath.    Cardiovascular: Negative for chest pain.   Neurological: Negative for dizziness.       PHQ-9 Total: 0              Personalized Written Plan:    Last OV: 09/26/2021 with PCP.     Follow up/Additional:Next Visit:  1) Tdap (tetanus) vaccine, Referred to PCP, [facility] or Pharmacy.    2) Shingles vaccine series -starting 2023 Medicare is covering a portion or all of the cost of the vaccine depending on your insurance plan. You may have the pharmacy run your insurance card to inquire about your insurance coverage or you may call your insurance company.Referred to PCP, [facility] or Pharmacy.    3) Advance Directive -Please complete, make a copy and mail back to office or bring to office to be scanned into your chart:    [facility]  [address]  [place],   [address]    4) Mammogram active order,  call to schedule your appointment [phone] x [phone] or [phone].    Duchess denies any additional questions or concerns at this visit.  Ashonti is encouraged to call with any further questions or concerns.    Orders Placed During Visit:  No orders of the defined types were placed in this encounter.        Vitals:  Blood pressure 124/70, pulse 71, height 5' 2.5 (1.588 m), weight 163 lb 9.6 oz (74.2 kg), SpO2 97 %, not currently breastfeeding.  Body mass index is 29.45  kg/m?.    Vaccines:  Immunization History   Administered Date(s) Administered   . FLU 65+yrs QUADRIVALENT ADJUVANTED INJ(PF) 04/05/2020, 06/21/2021   . Flu 6mo-64yrs Quadrivalent Inj(PF) 05/09/2017   . Flu High Dose (PF) 03/21/2018, 05/09/2019   . Pfizer 12 yr+ SARS-CoV-2 monovalent vaccine (PURPLE cap) 10/17/2019, 11/07/2019, 05/21/2020   . Pneumococcal Conjugate 13-Valent 03/21/2018   . Pneumococcal Polysaccharide 05/09/2019        The CDC and ACIP:  Influenza-recommended annually.  Td/Tdap: Not on file  - Those who have or anticipate having close contact with an infant aged less than 12 months should receive a single dose of Tdap.  - Other adults ages 65 years and older may be given a single dose of Tdap.  Shingles  - Recommended routine vaccination of all persons aged ?50 years with 2 doses of herpes zoster vaccine (second dose given 2-6 months after first dose).  Adults Aged 65 years and Older:  Prevnar 20  - Recommends a single dose of PCV 20 for those who have not previously received any pneumococcal vaccine.       Home Safety & Falls:  Safe in current environment: Yes   [name]  Feels safe in their current home.    Encouraged to upgrade the following safety items in the home:    1) Guard rails on all stairs, inside and outside  2) Safety bar in bathtub and shower  3) Area rugs are non slip with a secure bottom    Fall Risk Assessment performed: Not at risk for falls.     Depression Screen:  PHQ-9 Total: 0 (Depression Screening Score)  Patient Depression Risk: Is not currently at risk. and Is under current treatment.  Current treatment:Tranylcypromine, working well at this time.     Mental Status:  No concern for cognitive impairment at this time.     Mini-Cog 01/04/2022 07/03/2018   Step 1: Three Word Registration 1 3   Step 2: Clock Drawing 2 Normal 2 Normal   Step 3: Immediate Word Recall 3 Words Recalled 3 Words Recalled   Mini Cog Score 5 5                   Based on previous and/or current Medicare  Assessments:  Are there any mental health conditions or any such risk factors that have been identified? No    End of Life Planning:          Surrogate Decision Maker: Zappone,[name] F  Chart Review [phone]    ACP Surrogate Discussion: ACP or surrogate decision-maker was documented in the medical record    Advance Directive on File: No, Encouraged patient to complete if not completed and return a copy to us.    Does the code status reflect the patient's wishes? No, Review This includes full code, advance directive, POLST, and/or POA  Please delete this section if the patient is full code and without any documents on file.    Present for discussion besides patient and provider: [name]      *Reminder: the legal default order for decision making in [place] is: spouse, registered domestic partner, adult children, parent, adult siblings, any adult relative, adult friend. If you want someone to speak for you who would not be first in the legal default order, filling out an advance directive is important.          Cancer Screen:  Colorectal: IFOB: Not on file.  Colonoscopy:  Not on file. Patient has an active order in place.  waiting to schedule colonoscopy    Lung Cancer Screen: Former; Cigarettes: 1989 - 06/02/2018; 0.50 packs/day for 30.00 years Pack year HX: 15 years..  Does not meet criteria    Breast Cancer Screen: 08/25/2019 BIlateral Mammogram. Recommended repeat every year. Patient has an active order in place.     Cervical/Ovarian Cancer Screen: Not on file.  Not recommended due to age. There is no good screening test for ovarian cancer. Symptoms of ovarian cancer include: persistent lower abdominal pain/bloating and vaginal bleeding.    UI Assessment  Does patient have urinary incontinence? No  If yes, is a plan of care in place? No    Osteoporosis Screen:  DXA Scan: 08/05/2018 Normal bone density. Recommended repeat in 5 years 08/06/2023.  Is currently up to date on screening.    Current  treatment:Vitamin D3    Abdominal Aortic Aneurysm Screen:   AAA Ultrasound Screening:  Does not meet criteria    HEP C: 02/28/2018 Negative      Diabetes and Lipid screening:  Diabetic Labs: Up to date.  Glucose   Date Value Ref Range Status   12/28/2020 98 80 - 99 mg/dL Final      Lab Results   Component Value Date    GLYCOA1C 6.2 (H) 12/28/2020          Lipid Panel:   Up to date.   Lab Results   Component Value Date    CHOL 224 (H) 12/28/2020    CHOL 192 05/07/2019    CHOL 191 02/28/2018     Lab Results   Component Value Date    HDL 45 12/28/2020    HDL 37 (L) 05/07/2019    HDL 38 (L) 02/28/2018     Lab Results   Component Value Date    LDLCALC 133 (H) 12/28/2020    LDLCALC 117 (H) 05/07/2019    LDLCALC 122 (H) 02/28/2018     Lab Results   Component Value Date    TRIG 230 (H) 12/28/2020    TRIG 192 (H) 05/07/2019    TRIG 155 (H) 02/28/2018    Current treatment:Mevacor    The U.S. Task Force on Preventive Services recommends a lipid panel and diabetic screening every 3 years or more frequently for high risk patients.      Diet and Exercise:  General adult health, diet and exercise recommendation of 3-4 cups of fresh fruits and vegetables and one hour of exercise daily.    In the past 12 months, did someone from your providers office talk with you about specific goals for your health? Yes  Is this something you would like to add to your next visit? No    In the past 12 months, did someone from your providers office ask you if there are things that make it hard for you to take care of your health? Yes  Is this something you would like to add to your next visit? No      Was colorectal cancer screening ordered during this visit? No    Was the patient educated and counseled about appropriate screening and preventive services? Yes    Patient instructions (the written plan) will be printed and given to the patient: Yes    Annual Wellness Visit portion of this visit completed by Rayli Wiederhold, LPN. Please contact the clinic  with any further questions or concerns.

## 2022-01-10 NOTE — Telephone Encounter (Addendum)
LMTCB kk----- Message from Carmell Austria, DO sent at 01/10/2022  5:11 PM PDT -----  Thyroid level high, but need to confirm as not all thyroid labs abnormal. Repeat TSH, Free T4 in 1 month. Rest 1 yr.

## 2022-01-10 NOTE — Telephone Encounter (Signed)
-----   Message from Carmell Austria, DO sent at 01/10/2022  5:11 PM PDT -----  Thyroid level high, but need to confirm as not all thyroid labs abnormal. Repeat TSH, Free T4 in 1 month. Rest 1 yr.

## 2022-01-10 NOTE — Telephone Encounter (Signed)
LMTCB ----- Message from Carmell Austria, DO sent at 01/10/2022  5:11 PM PDT -----  Thyroid level high, but need to confirm as not all thyroid labs abnormal. Repeat TSH, Free T4 in 1 month. Rest 1 yr.  ?

## 2022-01-11 NOTE — Telephone Encounter (Signed)
Spoke with patient and relayed message below, patient verbalized understanding and had no further questions. Reminder in recall.

## 2022-01-12 ENCOUNTER — Ambulatory Visit: Payer: Medicare Other | Admitting: Anesthesiology

## 2022-01-12 ENCOUNTER — Encounter
Admission: RE | Disposition: A | Discharge status: Home / Self Care | Payer: Self-pay | Source: Ambulatory Visit | Attending: Gastroenterology

## 2022-01-12 ENCOUNTER — Ambulatory Visit
Admission: RE | Admit: 2022-01-12 | Discharge: 2022-01-12 | Disposition: A | Discharge status: Home / Self Care | Payer: Medicare Other | Source: Ambulatory Visit | Attending: Gastroenterology | Admitting: Gastroenterology

## 2022-01-12 ENCOUNTER — Other Ambulatory Visit: Payer: Self-pay

## 2022-01-12 ENCOUNTER — Encounter: Payer: Self-pay | Admitting: Gastroenterology

## 2022-01-12 DIAGNOSIS — K3189 Other diseases of stomach and duodenum: Secondary | ICD-10-CM | POA: Diagnosis not present

## 2022-01-12 DIAGNOSIS — K2960 Other gastritis without bleeding: Secondary | ICD-10-CM | POA: Insufficient documentation

## 2022-01-12 DIAGNOSIS — R195 Other fecal abnormalities: Secondary | ICD-10-CM

## 2022-01-12 DIAGNOSIS — D124 Benign neoplasm of descending colon: Secondary | ICD-10-CM | POA: Diagnosis not present

## 2022-01-12 DIAGNOSIS — E119 Type 2 diabetes mellitus without complications: Secondary | ICD-10-CM | POA: Insufficient documentation

## 2022-01-12 DIAGNOSIS — K2101 Gastro-esophageal reflux disease with esophagitis, with bleeding: Secondary | ICD-10-CM | POA: Diagnosis not present

## 2022-01-12 DIAGNOSIS — D128 Benign neoplasm of rectum: Secondary | ICD-10-CM | POA: Diagnosis not present

## 2022-01-12 DIAGNOSIS — K635 Polyp of colon: Secondary | ICD-10-CM

## 2022-01-12 DIAGNOSIS — Z87891 Personal history of nicotine dependence: Secondary | ICD-10-CM | POA: Insufficient documentation

## 2022-01-12 DIAGNOSIS — I110 Hypertensive heart disease with heart failure: Secondary | ICD-10-CM | POA: Insufficient documentation

## 2022-01-12 DIAGNOSIS — D122 Benign neoplasm of ascending colon: Secondary | ICD-10-CM | POA: Insufficient documentation

## 2022-01-12 DIAGNOSIS — I509 Heart failure, unspecified: Secondary | ICD-10-CM | POA: Diagnosis not present

## 2022-01-12 DIAGNOSIS — K31819 Angiodysplasia of stomach and duodenum without bleeding: Secondary | ICD-10-CM | POA: Diagnosis not present

## 2022-01-12 DIAGNOSIS — D509 Iron deficiency anemia, unspecified: Secondary | ICD-10-CM

## 2022-01-12 HISTORY — PX: COLONOSCOPY WITH PROPOFOL: SHX5780

## 2022-01-12 HISTORY — PX: ESOPHAGOGASTRODUODENOSCOPY (EGD) WITH PROPOFOL: SHX5813

## 2022-01-12 HISTORY — PX: POLYPECTOMY: SHX5525

## 2022-01-12 LAB — GLUCOSE, CAPILLARY
Glucose-Capillary: 211 mg/dL — ABNORMAL HIGH (ref 70–99)
Glucose-Capillary: 179 mg/dL — ABNORMAL HIGH (ref 70–99)

## 2022-01-12 SURGERY — COLONOSCOPY WITH PROPOFOL
Anesthesia: General | Site: Rectum

## 2022-01-12 MED ORDER — LACTATED RINGERS IV SOLN: INTRAVENOUS | Status: DC

## 2022-01-12 MED ORDER — ACETAMINOPHEN 160 MG/5ML PO SOLN: 325.0000 mg | ORAL | Status: DC | PRN

## 2022-01-12 MED ORDER — ACETAMINOPHEN 325 MG PO TABS: 325.0000 mg | ORAL_TABLET | ORAL | Status: DC | PRN

## 2022-01-12 MED ORDER — ALBUTEROL SULFATE HFA 108 (90 BASE) MCG/ACT IN AERS
INHALATION_SPRAY | RESPIRATORY_TRACT | Status: DC | PRN
  Administered 2022-01-12 (×2): 2 via RESPIRATORY_TRACT

## 2022-01-12 MED ORDER — GLYCOPYRROLATE 0.2 MG/ML IJ SOLN
INTRAMUSCULAR | Status: DC | PRN
  Administered 2022-01-12: .2 mg via INTRAVENOUS

## 2022-01-12 MED ORDER — PROPOFOL 10 MG/ML IV BOLUS
INTRAVENOUS | Status: DC | PRN
  Administered 2022-01-12: 50 mg via INTRAVENOUS
  Administered 2022-01-12: 20 mg via INTRAVENOUS
  Administered 2022-01-12: 50 mg via INTRAVENOUS
  Administered 2022-01-12: 30 mg via INTRAVENOUS
  Administered 2022-01-12: 60 mg via INTRAVENOUS
  Administered 2022-01-12 (×4): 30 mg via INTRAVENOUS

## 2022-01-12 MED ORDER — STERILE WATER FOR IRRIGATION IR SOLN
Status: DC | PRN
  Administered 2022-01-12: 50 mL

## 2022-01-12 MED ORDER — LIDOCAINE HCL (CARDIAC) PF 100 MG/5ML IV SOSY
PREFILLED_SYRINGE | INTRAVENOUS | Status: DC | PRN
  Administered 2022-01-12: 40 mg via INTRAVENOUS

## 2022-01-12 MED ORDER — ONDANSETRON HCL 4 MG/2ML IJ SOLN: 4.0000 mg | Freq: Once | INTRAMUSCULAR | Status: DC | PRN

## 2022-01-12 MED ORDER — SODIUM CHLORIDE 0.9 % IV SOLN: INTRAVENOUS | Status: DC

## 2022-01-12 SURGICAL SUPPLY — 40 items
BALLN DILATOR 12-15 8 (BALLOONS)
BALLN DILATOR 15-18 8 (BALLOONS)
BALLN DILATOR CRE 0-12 8 (BALLOONS)
BALLN DILATOR ESOPH 8 10 CRE (MISCELLANEOUS) IMPLANT
BALLOON DILATOR 12-15 8 (BALLOONS) IMPLANT
BALLOON DILATOR 15-18 8 (BALLOONS) IMPLANT
BALLOON DILATOR CRE 0-12 8 (BALLOONS) IMPLANT
BLOCK BITE 60FR ADLT L/F GRN (MISCELLANEOUS) ×4 IMPLANT
CLIP HMST 235XBRD CATH ROT (MISCELLANEOUS) IMPLANT
CLIP HMST11XOPN 235X2.8X (MISCELLANEOUS) IMPLANT
CLIP RESOLUTION 360 11X235 (MISCELLANEOUS) ×4
ELECT REM PT RETURN 9FT ADLT (ELECTROSURGICAL)
ELECTRODE REM PT RTRN 9FT ADLT (ELECTROSURGICAL) IMPLANT
FCP ESCP3.2XJMB 240X2.8X (MISCELLANEOUS)
FORCEPS BIOP RAD 4 LRG CAP 4 (CUTTING FORCEPS) ×1 IMPLANT
FORCEPS BIOP RJ4 240 W/NDL (MISCELLANEOUS)
FORCEPS ESCP3.2XJMB 240X2.8X (MISCELLANEOUS) IMPLANT
GOWN CVR UNV OPN BCK APRN NK (MISCELLANEOUS) ×6 IMPLANT
GOWN ISOL THUMB LOOP REG UNIV (MISCELLANEOUS) ×8
INJECTOR VARIJECT VIN23 (MISCELLANEOUS) IMPLANT
KIT DEFENDO VALVE AND CONN (KITS) IMPLANT
KIT PRC NS LF DISP ENDO (KITS) ×3 IMPLANT
KIT PROCEDURE OLYMPUS (KITS) ×4
MANIFOLD NEPTUNE II (INSTRUMENTS) ×4 IMPLANT
MARKER SPOT ENDO TATTOO 5ML (MISCELLANEOUS) IMPLANT
PROBE APC STR FIRE (PROBE) ×1 IMPLANT
PROBE INJECTION GOLD (MISCELLANEOUS)
PROBE INJECTION GOLD 7FR (MISCELLANEOUS) IMPLANT
RETRIEVER NET PLAT FOOD (MISCELLANEOUS) IMPLANT
RETRIEVER NET ROTH 2.5X230 LF (MISCELLANEOUS) IMPLANT
SNARE COLD EXACTO (MISCELLANEOUS) ×1 IMPLANT
SNARE SHORT THROW 13M SML OVAL (MISCELLANEOUS) IMPLANT
SNARE SHORT THROW 30M LRG OVAL (MISCELLANEOUS) IMPLANT
SNARE SNG USE RND 15MM (INSTRUMENTS) IMPLANT
SPOT EX ENDOSCOPIC TATTOO (MISCELLANEOUS)
SYR INFLATION 60ML (SYRINGE) IMPLANT
TRAP ETRAP POLY (MISCELLANEOUS) IMPLANT
VARIJECT INJECTOR VIN23 (MISCELLANEOUS)
WATER STERILE IRR 250ML POUR (IV SOLUTION) ×4 IMPLANT
WIRE CRE 18-20MM 8CM F G (MISCELLANEOUS) IMPLANT

## 2022-01-12 NOTE — Anesthesia Preprocedure Evaluation (Signed)
Anesthesia Evaluation  Patient identified by MRN, date of birth, ID band Patient awake    Reviewed: Allergy & Precautions, NPO status   Airway Mallampati: II       Dental   Pulmonary shortness of breath and Lance-Term Oxygen Therapy, former smoker,     + wheezing      Cardiovascular hypertension, +CHF   Rhythm:Regular Rate:Normal     Neuro/Psych    GI/Hepatic   Endo/Other  diabetes  Renal/GU      Musculoskeletal   Abdominal   Peds  Hematology   Anesthesia Other Findings   Reproductive/Obstetrics                             Anesthesia Physical Anesthesia Plan  ASA: 3  Anesthesia Plan: General   Post-op Pain Management:    Induction: Intravenous  PONV Risk Score and Plan: Ondansetron  Airway Management Planned: Natural Airway  Additional Equipment:   Intra-op Plan:   Post-operative Plan:   Informed Consent: I have reviewed the patients History and Physical, chart, labs and discussed the procedure including the risks, benefits and alternatives for the proposed anesthesia with the patient or authorized representative who has indicated his/her understanding and acceptance.       Plan Discussed with: CRNA  Anesthesia Plan Comments: (Mild wheezing at rest.  Pt states she occasionally uses oxygen at home and forgot her inhalers this morning.  Will give albuterol before starting procedure)        Anesthesia Quick Evaluation

## 2022-01-12 NOTE — Transfer of Care (Signed)
Immediate Anesthesia Transfer of Care Note  Patient: Robin [name]  Procedure(s) Performed: COLONOSCOPY WITH PROPOFOL ESOPHAGOGASTRODUODENOSCOPY (EGD) WITH BIOPSY (Mouth) POLYPECTOMY (Rectum)  Patient Location: PACU  Anesthesia Type: General  Level of Consciousness: awake, alert  and patient cooperative  Airway and Oxygen Therapy: Patient Spontanous Breathing and Patient connected to supplemental oxygen  Post-op Assessment: Post-op Vital signs reviewed, Patient's Cardiovascular Status Stable, Respiratory Function Stable, Patent Airway and No signs of Nausea or vomiting  Post-op Vital Signs: Reviewed and stable  Complications: No notable events documented.

## 2022-01-12 NOTE — Op Note (Signed)
[facility] Gastroenterology Patient Name: Robin [name] Procedure Date: 01/12/2022 8:55 AM MRN: [id number] Account #: [id number] Date of Birth: 24-Apr-1951 Admit Type: Outpatient Age: 71 Room: [facility] OR ROOM 01 Gender: Female Note Status: Finalized Instrument Name: [id number] Procedure:             Upper GI endoscopy Indications:           Unexplained iron deficiency anemia Providers:             Lin Landsman MD, MD Referring MD:          Ngwe A. Aycock MD (Referring MD) Medicines:             General Anesthesia Complications:         No immediate complications. Estimated blood loss: None. Procedure:             Pre-Anesthesia Assessment:                        - Prior to the procedure, a History and Physical was                         performed, and patient medications and allergies were                         reviewed. The patient is competent. The risks and                         benefits of the procedure and the sedation options and                         risks were discussed with the patient. All questions                         were answered and informed consent was obtained.                         Patient identification and proposed procedure were                         verified by the physician, the nurse, the                         anesthesiologist, the anesthetist and the technician                         in the pre-procedure area in the procedure room in the                         endoscopy suite. Mental Status Examination: alert and                         oriented. Airway Examination: normal oropharyngeal                         airway and neck mobility. Respiratory Examination:                         clear to auscultation. CV Examination: normal.  Prophylactic Antibiotics: The patient does not require                         prophylactic antibiotics. Prior Anticoagulants: The                         patient has taken  no previous anticoagulant or                         antiplatelet agents. ASA Grade Assessment: II - A                         patient with mild systemic disease. After reviewing                         the risks and benefits, the patient was deemed in                         satisfactory condition to undergo the procedure. The                         anesthesia plan was to use general anesthesia.                         Immediately prior to administration of medications,                         the patient was re-assessed for adequacy to receive                         sedatives. The heart rate, respiratory rate, oxygen                         saturations, blood pressure, adequacy of pulmonary                         ventilation, and response to care were monitored                         throughout the procedure. The physical status of the                         patient was re-assessed after the procedure.                        After obtaining informed consent, the endoscope was                         passed under direct vision. Throughout the procedure,                         the patient's blood pressure, pulse, and oxygen                         saturations were monitored continuously. The Endoscope                         was introduced through the mouth, and advanced to the  third part of duodenum. The upper GI endoscopy was                         accomplished without difficulty. The patient tolerated                         the procedure well. Findings:      Multiple small angioectasias without bleeding were found in the second       portion of the duodenum and in the third portion of the duodenum.       Coagulation for hemostasis using argon plasma at 2 liters/minute and 40       watts was successful. Estimated blood loss: none.      A few localized diminutive erosions with no bleeding and no stigmata of       recent bleeding were found at the incisura.  Biopsies were taken with a       cold forceps for Helicobacter pylori testing.      Patchy mildly erythematous mucosa without bleeding was found in the       gastric antrum. Biopsies were taken with a cold forceps for Helicobacter       pylori testing.      Striped mildly erythematous mucosa without bleeding was found in the       gastric body. Biopsies were taken with a cold forceps for Helicobacter       pylori testing.      The cardia and gastric fundus were normal on retroflexion.      Esophagogastric landmarks were identified: the gastroesophageal junction       was found at 35 cm from the incisors.      LA Grade B (one or more mucosal breaks greater than 5 mm, not extending       between the tops of two mucosal folds) esophagitis with bleeding was       found at the gastroesophageal junction. 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                         Treated with argon plasma coagulation (APC).                        - Erosive gastropathy with no bleeding and no stigmata                         of recent bleeding. Biopsied.                        - Erythematous mucosa in the antrum. Biopsied.                        - Erythematous mucosa in the gastric body. Biopsied.                        - Esophagogastric landmarks identified.                        - LA Grade B reflux esophagitis with bleeding. Recommendation:        - Await pathology results.                        -  Follow an antireflux regimen.                        - Use a proton pump inhibitor PO BID for 3 months.                        - Proceed with colonoscopy as scheduled                        See colonoscopy report Procedure Code(s):     --- Professional ---                        25956, 7, Esophagogastroduodenoscopy, flexible,                         transoral; with control of bleeding, any method                        43239, Esophagogastroduodenoscopy, flexible,                          transoral; with biopsy, single or multiple Diagnosis Code(s):     --- Professional ---                        K31.819, Angiodysplasia of stomach and duodenum                         without bleeding                        K31.89, Other diseases of stomach and duodenum                        K21.01, Gastro-esophageal reflux disease with                         esophagitis, with bleeding                        D50.9, Iron deficiency anemia, unspecified CPT copyright 2019 American Medical Association. All rights reserved. The codes documented in this report are preliminary and upon coder review may  be revised to meet current compliance requirements. Dr. Ulyess Mort Lin Landsman MD, MD 01/12/2022 9:33:51 AM This report has been signed electronically. Number of Addenda: 0 Note Initiated On: 01/12/2022 8:55 AM Total Procedure Duration: 0 hours 25 minutes 0 seconds  Estimated Blood Loss:  Estimated blood loss: none.      [facility]

## 2022-01-12 NOTE — Op Note (Signed)
[facility] Patient Name: Robin [name] Procedure Date: 01/12/2022 8:54 AM MRN: [id number] Account #: [id number] Date of Birth: Oct 11, 1950 Admit Type: Outpatient Age: 71 Room: [facility] OR ROOM 01 Gender: Female Note Status: Finalized Instrument Name: [id number] Procedure:             Colonoscopy Indications:           Unexplained iron deficiency anemia, Positive fecal                         immunochemical test Providers:             Lin Landsman MD, MD Referring MD:          Edmonia Lynch. Aycock MD (Referring MD) Medicines:             General Anesthesia Complications:         No immediate complications. Estimated blood loss:                         Minimal. Procedure:             Pre-Anesthesia Assessment:                        - Prior to the procedure, a History and Physical was                         performed, and patient medications and allergies were                         reviewed. The patient is competent. The risks and                         benefits of the procedure and the sedation options and                         risks were discussed with the patient. All questions                         were answered and informed consent was obtained.                         Patient identification and proposed procedure were                         verified by the physician, the nurse, the                         anesthesiologist, the anesthetist and the technician                         in the pre-procedure area in the procedure room in the                         endoscopy suite. Mental Status Examination: alert and                         oriented. Airway Examination: normal oropharyngeal  airway and neck mobility. Respiratory Examination:                         clear to auscultation. CV Examination: normal.                         Prophylactic Antibiotics: The patient does not require                         prophylactic  antibiotics. Prior Anticoagulants: The                         patient has taken no previous anticoagulant or                         antiplatelet agents. ASA Grade Assessment: II - A                         patient with mild systemic disease. After reviewing                         the risks and benefits, the patient was deemed in                         satisfactory condition to undergo the procedure. The                         anesthesia plan was to use general anesthesia.                         Immediately prior to administration of medications,                         the patient was re-assessed for adequacy to receive                         sedatives. The heart rate, respiratory rate, oxygen                         saturations, blood pressure, adequacy of pulmonary                         ventilation, and response to care were monitored                         throughout the procedure. The physical status of the                         patient was re-assessed after the procedure.                        After obtaining informed consent, the colonoscope was                         passed under direct vision. Throughout the procedure,                         the patient's blood pressure, pulse, and oxygen  saturations were monitored continuously. The                         Colonoscope was introduced through the anus and                         advanced to the the terminal ileum, with                         identification of the appendiceal orifice and IC                         valve. The colonoscopy was performed without                         difficulty. The patient tolerated the procedure well.                         The quality of the bowel preparation was evaluated                         using the BBPS [facility] Bowel Preparation Scale) with                         scores of: Right Colon = 3, Transverse Colon = 3 and                         Left Colon =  3 (entire mucosa seen well with no                         residual staining, small fragments of stool or opaque                         liquid). The total BBPS score equals 9. Findings:      The perianal and digital rectal examinations were normal. Pertinent       negatives include normal sphincter tone and no palpable rectal lesions.      The terminal ileum appeared normal.      Six sessile polyps were found in the rectum, descending colon and       ascending colon. The polyps were 5 to 8 mm in size. These polyps were       removed with a cold snare. Resection and retrieval were complete. To       prevent bleeding after the polypectomy, one hemostatic clip was       successfully placed. There was no bleeding during, or at the end, of the       procedure.      The rectum appeared normal. Impression:            - The examined portion of the ileum was normal.                        - Six 5 to 8 mm polyps in the rectum, in the                         descending colon and in the ascending colon, removed  with a cold snare. Resected and retrieved. Clip was                         placed.                        - The rectum is normal. Recommendation:        - Discharge patient to home (with escort).                        - Resume previous diet today.                        - Continue present medications.                        - Await pathology results.                        - Repeat colonoscopy in 3 years for surveillance of                         multiple polyps. Procedure Code(s):     --- Professional ---                        [phone], Colonoscopy, flexible; with removal of                         tumor(s), polyp(s), or other lesion(s) by snare                         technique Diagnosis Code(s):     --- Professional ---                        K62.1, Rectal polyp                        K63.5, Polyp of colon                        D50.9, Iron deficiency anemia,  unspecified                        R19.5, Other fecal abnormalities CPT copyright 2019 American Medical Association. All rights reserved. The codes documented in this report are preliminary and upon coder review may  be revised to meet current compliance requirements. Dr. Ulyess Mort Lin Landsman MD, MD 01/12/2022 9:54:20 AM This report has been signed electronically. Number of Addenda: 0 Note Initiated On: 01/12/2022 8:54 AM Scope Withdrawal Time: 0 hours 14 minutes 32 seconds  Total Procedure Duration: 0 hours 16 minutes 4 seconds  Estimated Blood Loss:  Estimated blood loss was minimal.      [facility]

## 2022-01-12 NOTE — Anesthesia Postprocedure Evaluation (Signed)
Anesthesia Post Note  Patient: Jalacia A Shambaugh  Procedure(s) Performed: COLONOSCOPY WITH PROPOFOL ESOPHAGOGASTRODUODENOSCOPY (EGD) WITH BIOPSY (Mouth) POLYPECTOMY (Rectum)     Patient location during evaluation: PACU Anesthesia Type: General Level of consciousness: awake and alert Pain management: pain level controlled Vital Signs Assessment: post-procedure vital signs reviewed and stable Respiratory status: spontaneous breathing, nonlabored ventilation, respiratory function stable and patient connected to nasal cannula oxygen Cardiovascular status: blood pressure returned to baseline and stable Postop Assessment: no apparent nausea or vomiting Anesthetic complications: no   No notable events documented.  Wanda Plump Alyssha Housh

## 2022-01-12 NOTE — H&P (Signed)
Cephas Darby, MD [address]  [place]  [place], [place] [address]  Main: [phone]  Fax: [phone] Pager: [phone]  Primary Care Physician:  Donnie Coffin, MD Primary Gastroenterologist:  Dr. Cephas Darby  Pre-Procedure History & Physical: HPI:  Robin [name] is a 71 y.o. female is here for an endoscopy and colonoscopy.   Past Medical History:  Diagnosis Date   Anemia    Cellulitis    CHF (congestive heart failure) ([place])    Diabetes mellitus without complication ([place])    Heart failure (HCC)    High cholesterol    Hypertension     History reviewed. No pertinent surgical history.  Prior to Admission medications   Medication Sig Start Date End Date Taking? Authorizing Provider  albuterol (PROVENTIL HFA;VENTOLIN HFA) 108 (90 Base) MCG/ACT inhaler Inhale 2 puffs into the lungs every 4 (four) hours as needed for wheezing or shortness of breath. 06/22/16  Yes Frederich Cha, MD  atorvastatin (LIPITOR) 20 MG tablet Take 20 mg by mouth daily. 05/03/21  Yes [provider]  empagliflozin (JARDIANCE) 10 MG TABS tablet Take 1 tablet (10 mg total) by mouth daily before breakfast. 08/04/21  Yes Darylene Price A, FNP  furosemide (LASIX) 40 MG tablet Take 1 tablet (40 mg total) by mouth 2 (two) times daily. Patient taking differently: Take 40 mg by mouth daily. 07/24/21  Yes Wieting, Richard, MD  magnesium oxide (MAG-OX) 400 MG tablet Take 1 tablet by mouth 2 (two) times daily. 08/22/21  Yes [provider]  metFORMIN (GLUCOPHAGE-XR) 500 MG 24 hr tablet Take 500 mg by mouth 2 (two) times daily. 07/11/21  Yes [provider]  metoprolol succinate (TOPROL XL) 25 MG 24 hr tablet Take 0.5 tablets (12.5 mg total) by mouth at bedtime. 09/12/21  Yes Hackney, Tina A, FNP  mometasone-formoterol (DULERA) 100-5 MCG/ACT AERO Inhale 2 puffs into the lungs 2 (two) times daily. 07/24/21  Yes Wieting, Richard, MD  feeding supplement (ENSURE ENLIVE / ENSURE PLUS) LIQD Take  237 mLs by mouth 2 (two) times daily between meals. 07/24/21   Loletha Grayer, MD  Iron-FA-B Cmp-C-Biot-Probiotic (FUSION PLUS) CAPS Take 1 capsule by mouth daily. Patient not taking: Reported on 01/03/2022 12/28/21   Lin Landsman, MD  potassium chloride SA (KLOR-CON M) 20 MEQ tablet Take 1 tablet (20 mEq total) by mouth 2 (two) times daily. Patient taking differently: Take 20 mEq by mouth daily. 07/24/21   Loletha Grayer, MD    Allergies as of 12/27/2021   (No Known Allergies)    Family History  Problem Relation Age of Onset   Breast cancer Sister 5    Social History   Socioeconomic History   Marital status: Single    Spouse name: Not on file   Number of children: Not on file   Years of education: Not on file   Highest education level: Not on file  Occupational History   Not on file  Tobacco Use   Smoking status: Former    Packs/day: 1.00    Years: 47.00    Total pack years: 47.00    Types: Cigarettes   Smokeless tobacco: Never  Substance and Sexual Activity   Alcohol use: Yes    Comment: rare   Drug use: No   Sexual activity: Not on file  Other Topics Concern   Not on file  Social History Narrative   Not on file   Social Determinants of Health   Financial Resource Strain: Not  on file  Food Insecurity: Not on file  Transportation Needs: Not on file  Physical Activity: Not on file  Stress: Not on file  Social Connections: Not on file  Intimate Partner Violence: Not on file    Review of Systems: See HPI, otherwise negative ROS  Physical Exam: BP (!) 154/88   Pulse 75   Temp (!) 97.2 F (36.2 C) (Temporal)   Ht '4\' 11"'$  (1.499 m)   Wt 88 kg   SpO2 98%   BMI 39.18 kg/m  General:   Alert,  pleasant and cooperative in NAD Head:  Normocephalic and atraumatic. Neck:  Supple; no masses or thyromegaly. Lungs:  Clear throughout to auscultation.    Heart:  Regular rate and rhythm. Abdomen:  Soft, nontender and nondistended. Normal bowel sounds, without  guarding, and without rebound.   Neurologic:  Alert and  oriented x4;  grossly normal neurologically.  Impression/Plan: Robin [name] is here for an endoscopy and colonoscopy to be performed for IDA, FOBT positive  Risks, benefits, limitations, and alternatives regarding  endoscopy and colonoscopy have been reviewed with the patient.  Questions have been answered.  All parties agreeable.   Sherri Sear, MD  01/12/2022, 8:07 AM

## 2022-01-13 ENCOUNTER — Encounter: Payer: Self-pay | Admitting: Gastroenterology

## 2022-01-16 LAB — SURGICAL PATHOLOGY

## 2022-01-17 ENCOUNTER — Encounter: Payer: Self-pay | Admitting: Gastroenterology

## 2022-01-17 ENCOUNTER — Telehealth: Payer: Self-pay

## 2022-01-17 MED ORDER — OMEPRAZOLE 40 MG PO CPDR: 40.0000 mg | DELAYED_RELEASE_CAPSULE | Freq: Two times a day (BID) | ORAL | 0 refills | Status: DC

## 2022-01-17 NOTE — Telephone Encounter (Signed)
Sent medication to [facility]. Called patient and left a message for call back

## 2022-01-17 NOTE — Telephone Encounter (Signed)
Patient verbalized understanding of results  

## 2022-01-17 NOTE — Telephone Encounter (Signed)
-----   Message from Lin Landsman, MD sent at 01/17/2022 11:29 AM EDT ----- Please inform patient that the pathology results from upper endoscopy came back normal.  She does have inflammation in her esophagus from the endoscopy.  Therefore, recommend to take Prilosec 40 mg twice daily before meals for 3 months, then stop  Rohini Vanga

## 2022-01-17 NOTE — Telephone Encounter (Signed)
Patient left vm returning your call. Please return patients call.

## 2022-01-18 ENCOUNTER — Inpatient Hospital Stay: Admit: 2022-01-18 | Payer: MEDICARE | Attending: DO | Primary: FNP

## 2022-01-18 DIAGNOSIS — Z1231 Encounter for screening mammogram for malignant neoplasm of breast: Secondary | ICD-10-CM

## 2022-01-20 NOTE — Telephone Encounter (Signed)
-----   Message from Carmell Austria, DO sent at 01/19/2022  5:31 PM PDT -----  WNL. REPEAT IN 1 yr.

## 2022-01-20 NOTE — Telephone Encounter (Signed)
LMTCB , need to discuss mammogram results with patient

## 2022-01-23 NOTE — Telephone Encounter (Signed)
Called the patient, spoke to her directly. Informed her of the results below. Patient verbalized understood.

## 2022-02-16 NOTE — Telephone Encounter (Signed)
-----   Message from Gracie Caryl Neverntucky sent at 01/11/2022  9:33 AM PDT -----  Tsh free t4

## 2022-02-17 NOTE — Telephone Encounter (Signed)
Telecia is aware. Labs ordered.

## 2022-02-27 ENCOUNTER — Other Ambulatory Visit: Payer: Self-pay | Admitting: Family

## 2022-02-27 MED ORDER — METOPROLOL SUCCINATE ER 25 MG PO TB24: 12.5000 mg | ORAL_TABLET | Freq: Every day | ORAL | 5 refills | Status: DC

## 2022-03-24 MED ORDER — tranylcypromine (PARNATE) 10 mg tablet
10 | ORAL_TABLET | ORAL | 0 refills | Status: DC
Start: 2022-03-24 — End: 2022-06-26

## 2022-03-24 MED ORDER — lovastatin (MEVACOR) 40 mg tablet
40 | ORAL_TABLET | ORAL | 0 refills | 90.00000 days | Status: DC
Start: 2022-03-24 — End: 2022-06-26

## 2022-03-24 NOTE — Telephone Encounter (Signed)
Medication Being Requested:  tranylcypromine (PARNATE) 10 mg tablet    lovastatin (MEVACOR) 40 mg tablet    Send To Pharmacy: [facility]    How many Days Remaining of medication: 3 days    The patient does request a return call.    Additional Information: Patient call back number is [phone]     Informed patient that it may take up to 48-72 hours to fill prescription.    Future Appointments:  Future Appointments   Date Time Provider Department Center   01/09/2023  1:00 PM Genelle Gather, LPN [id number]       Last Appointment this Department:  Last Office Visit: 01/04/2022 Genelle Gather, LPN  Last Telemed Visit: Visit date not found  Last Telemed2 Visit: Visit date not found

## 2022-03-24 NOTE — Telephone Encounter (Signed)
LOV 09/26/2021  Last labs: 01/04/2022    Patient is aware.     Requested Prescriptions     Pending Prescriptions Disp Refills   . tranylcypromine (PARNATE) 10 mg tablet 450 tablet 0     Sig: TAKE 2 TABLETS BY MOUTH IN THE MORNING AND 3 EVERY NIGHT AT BEDTIME   . lovastatin (MEVACOR) 40 mg tablet 90 tablet 0     Sig: TAKE 1 TABLET BY MOUTH EVERY NIGHT AT BEDTIME

## 2022-05-14 NOTE — Progress Notes (Deleted)
Patient ID: Robin [name], female    DOB: 25-Jul-1950, 71 y.o.   MRN: [id number]  HPI  Robin [name] is a 71 y.o. female with medical history significant of nicotine dependence, hypertension, hyperlipidemia, anemia, diabetes mellitus.   Echo results reviewed from 07/22/21, EF 40-98%, diastolic parameters consistent with Grade 1 diastolic dysfunction. RV moderately enlarged, LA mildly dilated.   Has not been admitted or been in the ED in the last 6 months.    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 Takes her once daily medications at nighttime because that works better for her.   Does have oxygen at home that she wears at 2L PRN.   Not weighing daily but does have working scales.  Past Medical History:  Diagnosis Date   Anemia    Cellulitis    CHF (congestive heart failure) (HCC)    Diabetes mellitus without complication ([place])    Heart failure (HCC)    High cholesterol    Hypertension    Past Surgical History:  Procedure Laterality Date   COLONOSCOPY WITH PROPOFOL N/A 01/12/2022   Procedure: COLONOSCOPY WITH PROPOFOL;  Surgeon: Lin Landsman, MD;  Location: [facility];  Service: Endoscopy;  Laterality: N/A;   ESOPHAGOGASTRODUODENOSCOPY (EGD) WITH PROPOFOL N/A 01/12/2022   Procedure: ESOPHAGOGASTRODUODENOSCOPY (EGD) WITH BIOPSY;  Surgeon: Lin Landsman, MD;  Location: [facility];  Service: Endoscopy;  Laterality: N/A;   POLYPECTOMY N/A 01/12/2022   Procedure: POLYPECTOMY;  Surgeon: Lin Landsman, MD;  Location: [facility];  Service: Endoscopy;  Laterality: N/A;   Family History  Problem Relation Age of Onset   Breast cancer Sister 22   Social History   Tobacco Use   Smoking status: Former    Packs/day: 1.00    Years: 47.00    Total pack years:  47.00    Types: Cigarettes   Smokeless tobacco: Never  Substance Use Topics   Alcohol use: Yes    Comment: rare   No Known Allergies  Review of Systems  Constitutional:  Negative for appetite change and fatigue.  HENT:  Negative for congestion, postnasal drip and sore throat.   Eyes: Negative.   Respiratory:  Positive for shortness of breath ("very little"). Negative for cough and chest tightness.   Cardiovascular:  Positive for palpitations (on ocassion) and leg swelling (little bit). Negative for chest pain.  Gastrointestinal:  Negative for abdominal distention and abdominal pain.  Endocrine: Negative.   Genitourinary: Negative.   Musculoskeletal:  Positive for arthralgias (leg pain at times). Negative for back pain.  Skin: Negative.   Allergic/Immunologic: Negative.   Neurological:  Negative for dizziness and light-headedness.  Hematological:  Negative for adenopathy. Does not bruise/bleed easily.  Psychiatric/Behavioral:  Negative for dysphoric mood and sleep disturbance. The patient is not nervous/anxious.       Physical Exam Vitals and nursing note reviewed. Exam conducted with a chaperone present (daughter).  Constitutional:      Appearance: Normal appearance.  HENT:     Head: Normocephalic and atraumatic.  Cardiovascular:     Rate and Rhythm: Regular rhythm. Bradycardia present.  Pulmonary:     Effort: Pulmonary effort is normal. No respiratory distress.     Breath sounds: No wheezing or rales.  Abdominal:     General:  There is no distension.     Palpations: Abdomen is soft.  Musculoskeletal:        General: No tenderness.     Cervical back: Normal range of motion and neck supple.     Right lower leg: No edema.     Left lower leg: No edema.  Skin:    General: Skin is warm and dry.  Neurological:     General: No focal deficit present.     Mental Status: She is alert and oriented to person, place, and time.  Psychiatric:        Mood and Affect: Mood normal.         Behavior: Behavior normal.        Thought Content: Thought content normal.     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 (LAE)- - NYHA II - euvolemic - has a scale but is not weighing daily; emphasized, again, to weigh daily and call for an overnight weight gain of > 2 pounds or a weekly weight gain of >5 pounds - weight 196 from last visit here 6 months ago - adhering to a low-sodium diet - on GDMT of jardiance - BP may not tolerate entresto; will continue to assess - has oxygen @ 2L that she wears PRN - takes all her once daily medications at bedtime because she's up and down with her mom all night so it doesn't bother her to take her fluid pill at bedtime - BNP 07/21/21 was 790.4  2: HTN: - BP  - sees PCP at [facility] from 08/22/21 showed Na 136, K 4.7, Cr 0.86, gfr 74  3: Diabetes: - A1C 7.3% on 07/21/21 - on glucophage 500 mg BID   Patient did not bring her medications nor a list. Each medication was verbally reviewed with the patient and she was encouraged to bring the bottles to every visit to confirm accuracy of list.

## 2022-05-15 ENCOUNTER — Telehealth: Payer: Self-pay | Admitting: Family

## 2022-05-15 ENCOUNTER — Ambulatory Visit: Payer: Medicare Other | Admitting: Family

## 2022-05-15 NOTE — Telephone Encounter (Signed)
Patient did not show for her [facility] appointment on 05/15/22. Will attempt to reschedule.

## 2022-05-24 ENCOUNTER — Ambulatory Visit: Admit: 2022-05-24 | Discharge: 2022-06-02 | Payer: MEDICARE | Attending: DO | Primary: DO

## 2022-05-24 DIAGNOSIS — M7541 Impingement syndrome of right shoulder: Secondary | ICD-10-CM

## 2022-05-24 MED ORDER — triamcinolone acetonide (KENALOG-40) injection 80 mg
40 | Freq: Once | INTRAMUSCULAR | Status: AC
Start: 2022-05-24 — End: 2022-05-24
  Administered 2022-05-24: 40 mg via INTRA_ARTICULAR

## 2022-05-24 MED ORDER — lidocaine 20 mg/mL (2 %) injection 2 mL
20 | Freq: Once | INTRAMUSCULAR | Status: AC
Start: 2022-05-24 — End: 2022-05-24
  Administered 2022-05-24: 20 mL via INTRA_ARTICULAR

## 2022-05-24 NOTE — Procedures (Signed)
After discussing the risks and benefits of injection, including possible infection, premature degeneration of the joint, skin dimpling, hematoma or tendon rupture, and rarely even death, the patient opted to proceed. Using aseptic technique, the Right  shoulder injected under sterile technique with a posterior approach and after initial aspiration attempted without blood return,  2 mL triamcinolone 40 mg per mL and 2 mL  2% lidocaine without epinephrine are injected into the joint. Patient tolerated procedure well and left the office in stable condition.

## 2022-05-24 NOTE — Progress Notes (Signed)
[name] is a 71 y.o. female seen today for Shoulder Pain and Arm Pain  .    SUBJECTIVE:  HPI    Patient presents with her husband [name].     Patient presents in office today for arm pain and shoulder x 3 weeks. Patient states its her right shoulder that comes down her shoulder blade to her wrist.   B/L hand/wrist numbness x years.    The following portions of the patient's history were reviewed and updated as appropriate: allergies, current medications, past family history, past medical history, past surgical history and problem list.    Current Outpatient Medications on File Prior to Visit   Medication Sig Dispense Refill   . cholecalciferol, vitamin D3, 5,000 unit capsule Take 1 capsule by mouth daily. 1 capsule 0   . lovastatin (MEVACOR) 40 mg tablet TAKE 1 TABLET BY MOUTH EVERY NIGHT AT BEDTIME 90 tablet 0   . tranylcypromine (PARNATE) 10 mg tablet TAKE 2 TABLETS BY MOUTH IN THE MORNING AND 3 EVERY NIGHT AT BEDTIME 450 tablet 0     No current facility-administered medications on file prior to visit.       Allergies   Allergen Reactions   . Nitrous Oxide Nausea And Vomiting   . Codeine Nausea And Vomiting       Past Medical History:   Diagnosis Date   . Bronchitis    . Depression     years ago   . GERD (gastroesophageal reflux disease)     intermittent-no Rx   . Heart murmur     ?   [name]        Past Surgical History:   Procedure Laterality Date   . BREAST CYST ASPIRATION Left    . DILATION AND CURETTAGE OF UTERUS     . PONV after dental     . PROCEDURE Left 11/03/2011    Procedure: OPEN REDUCTION INTERNAL FIXATION, BIMALLEOLAR  ANKLE;  Surgeon: [name], MD;  Location: [facility] OR;  Service: Orthopedics;  Laterality: Left;   . tonsils         Family History   Problem Relation Age of Onset   . Alzheimer's disease Mother    . Arthritis Father    . Lupus Sister    . Diabetes Brother    . No Known Health Problems Sister        Social History     Socioeconomic History   . Marital status: Married      Spouse name: [name]   . Number of children: 4   Tobacco Use   . Smoking status: Former     Packs/day: 0.50     Years: 30.00     Additional pack years: 0.00     Total pack years: 15.00     Types: Cigarettes     Start date: 63     Quit date: 06/02/2018     Years since quitting: 3.9   . Smokeless tobacco: Never   Substance and Sexual Activity   . Alcohol use: No   . Drug use: No   . Sexual activity: Yes     Partners: Male   Other Topics Concern   . Caffeine Concern Yes     Comment: occasional Pepsi   . Exercise No       REVIEW OF SYSTEMS:  Review of Systems   Constitutional: Negative for chills, fatigue and fever.   HENT: Negative for congestion.    Respiratory: Negative for  cough.    Cardiovascular: Negative for chest pain.       OBJECTIVE:  BP 128/70 (BP Location: Left arm, Patient Position: Sitting)   Pulse 77   Temp 36.8 ?C (98.2 ?F) (Oral)   Ht 5' 2.5 (1.588 m)   Wt 163 lb 14.4 oz (74.3 kg)   SpO2 96%   BMI 29.50 kg/m?   Body mass index is 29.5 kg/m?[name]    PHYSICAL EXAM:  Gen: no acute distress, nontoxic, alert, oriented, well nourished.  MSK: R shoulder joint  tenderness, reduced AROM,  or deformity.  Neuro:  A&O x 3, no sensory deficits, DTR's 2/4 upper and lower extremities, and no weakness.  Psych: normal affect w/o depression or anxiety. No suicidal or homicidal ideation or plan.   Skin: no lesions or rash with good turgor.      ASSESSMENT & PLAN:  (M75.41) Shoulder impingement syndrome, right  (primary encounter diagnosis)  Plan: triamcinolone acetonide (KENALOG-40) injection         80 mg, lidocaine 20 mg/mL (2 %) injection 2 mL    (G56.03) Bilateral carpal tunnel syndrome    (Z23) Need for influenza vaccination  Plan: Flu vaccine Quadrivalent ADJ PF 65+    Shoulder inj after PARQ. B/L EMG, when ready    I had a thoughtful and careful discussion regarding the issues today. All questions were answered. I educated and reassured. I encouraged follow up as needed.       This note was transcribed using  speech recognition software. Please contact us for clarification if any questions arise relating to the wording of this document.  In 2016, Congress passed the 21st Century Cures Act. The Open Notes rules of the Act went into effect in April, 2021. This law requires that medical notes such as this be made available to patients free of charge in the interest of transparency. However, please be advised that this note is a medical document. It is intended as peer to peer communication and is written in medical language which may contain abbreviations or verbiage that are unfamiliar. It may appear blunt or direct. Medical documents are intended to carry relevant information, facts as evident, and the clinical opinion of the practitioner.

## 2022-05-25 MED ORDER — diclofenac (VOLTAREN) 1 % topical gel
1 | TOPICAL | 3 refills | 23.00000 days | Status: AC
Start: 2022-05-25 — End: 2022-08-23

## 2022-05-25 NOTE — Telephone Encounter (Signed)
Patient called stating she was seen yesterday and discussed a pain cream. She checked with the pharmacy and there was not any prescriptions. She uses [facility]. I reviewed the chart. I informed her that Dr. Rayvon Char is working on her chart.  I will send a message and someone will call her back. She states understanding.

## 2022-05-25 NOTE — Telephone Encounter (Signed)
Dawana is aware.

## 2022-05-25 NOTE — Telephone Encounter (Signed)
Can send diclofenac sodium %0.1 gel apply 4 gm QID to affect joint(s) as directed for pain. 500 gm.

## 2022-06-26 MED ORDER — tranylcypromine (PARNATE) 10 mg tablet
10 | ORAL_TABLET | ORAL | 1 refills | Status: DC
Start: 2022-06-26 — End: 2022-12-14

## 2022-06-26 MED ORDER — lovastatin (MEVACOR) 40 mg tablet
40 | ORAL_TABLET | ORAL | 1 refills | 90.00000 days | Status: DC
Start: 2022-06-26 — End: 2022-12-14

## 2022-06-26 NOTE — Telephone Encounter (Signed)
Sent to the pharmacy

## 2022-06-26 NOTE — Telephone Encounter (Signed)
Medication Being Requested:  lovastatin (MEVACOR) 40 mg tablet, tranylcypromine (PARNATE) 10 mg tablet        Send To Pharmacy:   [id number] [address]  [address]  [place] [address]  Phone: [phone] Fax:[phone]      How many Days Remaining of medication: 0    Does the patient request follow up? No.    Additional Information: 0      Informed patient that it may take up to 48-72 hours to fill prescription.    Future Appointments:  Future Appointments   Date Time Provider Department Center   01/09/2023  1:00 PMClaudean Kindsmsen, [id number] APP Clinics       Last Appointment this Department:  Last Office Visit: 05/24/2022 Carmell Austria, DO  Last Telemed Visit: Visit date not found  Last Telemed2 Visit: Visit date not found

## 2022-07-04 NOTE — Telephone Encounter (Signed)
[name] questioned if she is due for labs. She is aware she has thyroid labs ordered. Annual labs are not due until January 06, 2023.  [name] is aware Dr. Rayvon Char is moving out of state. She Is aware she needs to establish with a new primary care provider.

## 2022-10-02 ENCOUNTER — Encounter: Payer: Self-pay | Admitting: Family

## 2022-10-02 ENCOUNTER — Ambulatory Visit: Payer: 59 | Attending: Family | Admitting: Family

## 2022-10-02 VITALS — BP 138/72 | HR 71 | Resp 14 | Wt 199.4 lb

## 2022-10-02 DIAGNOSIS — I110 Hypertensive heart disease with heart failure: Secondary | ICD-10-CM | POA: Diagnosis present

## 2022-10-02 DIAGNOSIS — I428 Other cardiomyopathies: Secondary | ICD-10-CM | POA: Insufficient documentation

## 2022-10-02 DIAGNOSIS — I5032 Chronic diastolic (congestive) heart failure: Secondary | ICD-10-CM

## 2022-10-02 DIAGNOSIS — Z87891 Personal history of nicotine dependence: Secondary | ICD-10-CM | POA: Insufficient documentation

## 2022-10-02 DIAGNOSIS — I10 Essential (primary) hypertension: Secondary | ICD-10-CM | POA: Diagnosis not present

## 2022-10-02 DIAGNOSIS — Z79899 Other long term (current) drug therapy: Secondary | ICD-10-CM | POA: Insufficient documentation

## 2022-10-02 DIAGNOSIS — E785 Hyperlipidemia, unspecified: Secondary | ICD-10-CM | POA: Diagnosis not present

## 2022-10-02 DIAGNOSIS — Z7984 Long term (current) use of oral hypoglycemic drugs: Secondary | ICD-10-CM | POA: Insufficient documentation

## 2022-10-02 DIAGNOSIS — E119 Type 2 diabetes mellitus without complications: Secondary | ICD-10-CM | POA: Diagnosis not present

## 2022-10-02 DIAGNOSIS — I509 Heart failure, unspecified: Secondary | ICD-10-CM | POA: Insufficient documentation

## 2022-10-02 DIAGNOSIS — E1169 Type 2 diabetes mellitus with other specified complication: Secondary | ICD-10-CM | POA: Diagnosis not present

## 2022-10-02 NOTE — Patient Instructions (Signed)
Call us in the future if you need us for anything 

## 2022-10-02 NOTE — Progress Notes (Signed)
Patient ID: Robin [name], female    DOB: June 05, 1951, 72 y.o.   MRN: [id number]  Primary cardiologist: None PCP: Emogene Morgan, MD (last seen 12/23)  HPI  Robin [name] is a 72 y.o. female with medical history significant of nicotine dependence, hypertension, hyperlipidemia, anemia, diabetes mellitus.   Echo 07/22/21: EF 60-65%, diastolic parameters consistent with Grade 1 diastolic dysfunction. RV moderately enlarged, LA mildly dilated.   Has not been admitted or been in the ED in the last 6 months.   She presents today for a HF follow-up visit with a chief complaint of minimal SOB. Chronic in nature. Has occasional leg pain and slight weight gain along with this. Denies difficulty sleeping, dizziness, abdominal distention, palpitations, pedal edema, chest pain, cough or fatigue.   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 Takes her once daily medications at nighttime because that works better for her.   Says that she knows where her weight gain has come from. She says that she likes to ice cream especially late in the day.   Past Medical History:  Diagnosis Date   Anemia    Cellulitis    CHF (congestive heart failure) (HCC)    Diabetes mellitus without complication (HCC)    Heart failure (HCC)    High cholesterol    Hypertension    Past Surgical History:  Procedure Laterality Date   COLONOSCOPY WITH PROPOFOL N/A 01/12/2022   Procedure: COLONOSCOPY WITH PROPOFOL;  Surgeon: Toney Reil, MD;  Location: [facility];  Service: Endoscopy;  Laterality: N/A;   ESOPHAGOGASTRODUODENOSCOPY (EGD) WITH PROPOFOL N/A 01/12/2022   Procedure: ESOPHAGOGASTRODUODENOSCOPY (EGD) WITH BIOPSY;  Surgeon: Toney Reil, MD;  Location: [facility];  Service: Endoscopy;  Laterality: N/A;   POLYPECTOMY N/A 01/12/2022   Procedure: POLYPECTOMY;  Surgeon: Toney Reil, MD;  Location: [facility];  Service: Endoscopy;  Laterality: N/A;   Family History  Problem Relation Age of Onset   Breast cancer Sister 48   Social History   Tobacco Use   Smoking status: Former    Packs/day: 1.00    Years: 47.00    Additional pack years: 0.00    Total pack years: 47.00    Types: Cigarettes   Smokeless tobacco: Never  Substance Use Topics   Alcohol use: Yes    Comment: rare   No Known Allergies  Prior to Admission medications   Medication Sig Start Date End Date Taking? Authorizing Provider  acetaminophen (TYLENOL) 500 MG tablet Take 500 mg by mouth every 6 (six) hours as needed for mild pain.   Yes [provider]  albuterol (PROVENTIL HFA;VENTOLIN HFA) 108 (90 Base) MCG/ACT inhaler Inhale 2 puffs into the lungs every 4 (four) hours as needed for wheezing or shortness of breath. 06/22/16  Yes Hassan Rowan, MD  aspirin 81 MG chewable tablet Chew 81 mg by mouth daily.   Yes [provider]  atorvastatin (LIPITOR) 20 MG tablet Take 20 mg by mouth daily. 05/03/21  Yes [provider]  feeding supplement (ENSURE ENLIVE / ENSURE PLUS) LIQD Take 237 mLs by mouth 2 (two) times daily between meals. 07/24/21  Yes Wieting, Richard, MD  furosemide (LASIX) 20 MG tablet Take 20 mg by mouth daily. 07/06/22  Yes [provider]  JARDIANCE 25 MG TABS tablet Take 25 mg by mouth daily. 07/06/22  Yes [provider]  metFORMIN (GLUCOPHAGE-XR) 500 MG 24 hr tablet Take 500 mg by mouth 2 (two) times daily. 07/11/21  Yes [provider]  metoprolol succinate (TOPROL XL) 25 MG 24 hr tablet Take 0.5 tablets (12.5 mg total) by mouth at bedtime. Patient taking differently: Take 12.5 mg by mouth at bedtime. Takes one half tablet every other day 02/27/22  Yes Kearie Mennen A, FNP  mometasone-formoterol (DULERA) 100-5 MCG/ACT AERO Inhale 2 puffs into the lungs 2 (two) times daily. Patient not taking: Reported on 10/02/2022  07/24/21   Alford Highland, MD  omeprazole (PRILOSEC) 40 MG capsule Take 1 capsule (40 mg total) by mouth 2 (two) times daily before a meal. Patient not taking: Reported on 10/02/2022 01/17/22   Toney Reil, MD  potassium chloride SA (KLOR-CON M) 20 MEQ tablet Take 1 tablet (20 mEq total) by mouth 2 (two) times daily. Patient not taking: Reported on 10/02/2022 07/24/21   Alford Highland, MD    Review of Systems  Constitutional:  Negative for appetite change and fatigue.  HENT:  Negative for congestion, postnasal drip and sore throat.   Eyes: Negative.   Respiratory:  Positive for shortness of breath ("very little"). Negative for cough and chest tightness.   Cardiovascular:  Negative for chest pain, palpitations and leg swelling.  Gastrointestinal:  Negative for abdominal distention and abdominal pain.  Endocrine: Negative.   Genitourinary: Negative.   Musculoskeletal:  Positive for arthralgias (leg pain at times). Negative for back pain.  Skin: Negative.   Allergic/Immunologic: Negative.   Neurological:  Negative for dizziness and light-headedness.  Hematological:  Negative for adenopathy. Does not bruise/bleed easily.  Psychiatric/Behavioral:  Negative for dysphoric mood and sleep disturbance (sleeping on 2 pillows). The patient is not nervous/anxious.    Vitals:   10/02/22 1121  BP: 138/72  Pulse: 71  Resp: 14  SpO2: 98%  Weight: 199 lb 6 oz (90.4 kg)   Wt Readings from Last 3 Encounters:  10/02/22 199 lb 6 oz (90.4 kg)  01/12/22 194 lb (88 kg)  12/27/21 196 lb 8 oz (89.1 kg)   Lab Results  Component Value Date   CREATININE 0.80 07/24/2021   CREATININE 0.92 07/23/2021   CREATININE 0.64 07/22/2021   Physical Exam Vitals and nursing note reviewed. Exam conducted with a chaperone present (daughter).  Constitutional:      Appearance: Normal appearance.  HENT:     Head: Normocephalic and atraumatic.  Cardiovascular:     Rate and Rhythm: Normal rate and regular rhythm.   Pulmonary:     Effort: Pulmonary effort is normal. No respiratory distress.     Breath sounds: No wheezing or rales.  Abdominal:     General: There is no distension.     Palpations: Abdomen is soft.  Musculoskeletal:        General: No tenderness.     Cervical back: Normal range of motion and neck supple.     Right lower leg: No edema.     Left lower leg: No edema.  Skin:    General: Skin is warm and dry.  Neurological:     General: No focal deficit present.     Mental Status: She is alert and oriented to person, place, and time.  Psychiatric:        Mood and Affect: Mood normal.        Behavior: Behavior normal.        Thought Content: Thought content normal.   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- - NYHA II - euvolemic - weighing; reminded to call for an overnight weight gain of > 2 pounds or a weekly weight gain of >5 pounds - weight up 3 pounds from last visit here 6 months ago - adhering to a low-sodium diet - continue jardiance 25mg  daily - continue metoprolol succinate 12.5mg  QOD - continue furosemide 20mg  daily - has oxygen @ 2L that she wears PRN - takes all her once daily medications at bedtime because she's up and down with her mom all night so it doesn't bother her to take her fluid pill at bedtime - BNP 07/21/21 was 790.4  2: HTN: - BP 138/72 - sees PCP at [facility] & says that she has to call and get an appt scheduled; last seen 12/23 - BMP from 08/22/21 showed Na 136, K 4.7, Cr 0.86, gfr 74  3: Diabetes: - A1C 7.3% on 07/21/21 - continue glucophage 500 mg BID - home glucose running "high" but she is unable to quantify a number - explained that eating ice cream will be high in sugar   Due to HF stability, will not make a return appointment at this time. Advised patient that she could call back at anytime for questions or if she needed to make another appointment and she was comfortable with this plan.

## 2022-10-06 ENCOUNTER — Other Ambulatory Visit: Payer: Self-pay

## 2022-10-06 MED ORDER — METOPROLOL SUCCINATE ER 25 MG PO TB24: 12.5000 mg | ORAL_TABLET | Freq: Every day | ORAL | 3 refills | Status: AC

## 2022-12-14 ENCOUNTER — Ambulatory Visit: Admit: 2022-12-14 | Discharge: 2022-12-18 | Payer: MEDICARE | Attending: FNP

## 2022-12-14 DIAGNOSIS — E782 Mixed hyperlipidemia: Secondary | ICD-10-CM

## 2022-12-14 MED ORDER — lovastatin (MEVACOR) 40 mg tablet
40 | ORAL_TABLET | ORAL | 1 refills | 90.00000 days | Status: DC
Start: 2022-12-14 — End: 2023-07-03

## 2022-12-14 MED ORDER — tranylcypromine (PARNATE) 10 mg tablet
10 | ORAL_TABLET | ORAL | 1 refills | Status: DC
Start: 2022-12-14 — End: 2023-07-03

## 2022-12-14 NOTE — Patient Instructions (Addendum)
[name], it was nice meeting you today.    I have refilled both your lovastatin and your Parnate and sent these to the [facility] pharmacy to the point.    Please reach out if you have any questions or concerns.

## 2022-12-14 NOTE — Progress Notes (Signed)
[name] is a 72 y.o. female.  Chief Complaint   Patient presents with    Medication Check        HISTORY OF PRESENT ILLNESS  HPI    Patient presents in office today for medication refills.     BP 136/78 (BP Location: Left arm, Patient Position: Sitting)   Pulse 66   Temp 36.7 ?C (98 ?F) (Oral)   Ht 5' 2.5 (1.588 m)   Wt 155 lb (70.3 kg)   SpO2 98%   BMI 27.90 kg/m?   Body mass index is 27.9 kg/m?[name]    Outpatient Medications Marked as Taking for the 12/14/22 encounter (Office Visit) with Emiliano Dyer, FNP   Medication Sig Dispense Refill    cholecalciferol, vitamin D3, 5,000 unit capsule Take 1 capsule by mouth daily. 1 capsule 0    [DISCONTINUED] lovastatin (MEVACOR) 40 mg tablet TAKE 1 TABLET BY MOUTH EVERY NIGHT AT BEDTIME 90 tablet 1    [DISCONTINUED] tranylcypromine (PARNATE) 10 mg tablet TAKE 2 TABLETS BY MOUTH IN THE MORNING AND 3 EVERY NIGHT AT BEDTIME 450 tablet 1      Allergies   Allergen Reactions    Nitrous Oxide Nausea And Vomiting    Codeine Nausea And Vomiting        REVIEW OF SYSTEMS  Review of Systems    PHYSICAL EXAM  Physical Exam  Vitals reviewed.   Constitutional:       Appearance: Normal appearance.   Cardiovascular:      Rate and Rhythm: Normal rate and regular rhythm.      Pulses: Normal pulses.      Heart sounds: Normal heart sounds.   Pulmonary:      Effort: Pulmonary effort is normal.      Breath sounds: Normal breath sounds.   Skin:     General: Skin is warm.      Capillary Refill: Capillary refill takes less than 2 seconds.   Neurological:      Mental Status: She is alert and oriented to person, place, and time. Mental status is at baseline.   Psychiatric:         Mood and Affect: Mood normal.         Behavior: Behavior normal. Behavior is cooperative.             ASSESSMENT & PLAN    ICD-10-CM    1. Mixed hyperlipidemia  E78.2       2. Recurrent major depressive disorder, in partial remission (HCC)  F33.41          Mixed hyperlipidemia  The patient has an existing history  of hyperlipidemia presents today for medication refill.  The patient is taking lovastatin 40 mg tablet by mouth once daily for this concern.  It has been keeping her cholesterol levels stable and within goal.  She denies having any lower extremity edema, chest pain or pressure, shortness of breath, fatigue, activity intolerance, brain fog or arthralgias or myalgias.  Will continue patient's current medication therapy.    Recurrent major depressive disorder, in partial remission  The patient has a [place] history of major depressive disorder and has been on MAOI medications for 20 years.  She is currently taking Parnate 10 mg tablet by mouth once daily.  States this medication works very well for her as she does not have any depressive symptoms and feels like she has never had depression while taking this medication.  Denies having any side effects from the medication.  With this being a [place]-term medication for her being so highly effective I have renewed the prescription for her.      No follow-ups on file.    Counseling and anticipatory guidance in regards to the above diagnoses was provided during today's visit with emphasis on risk reduction.     Patient advised to contact clinic for any questions, if condition changes or if new concerns develop.  Warning signs discussed warranting immediate follow-up as well as concerning symptoms warranting ER/911    This note was transcribed using speech recognition software. Please contact us for clarification if any questions arise relating to the wording of this document.

## 2022-12-14 NOTE — Assessment & Plan Note (Signed)
The patient has an existing history of hyperlipidemia presents today for medication refill.  The patient is taking lovastatin 40 mg tablet by mouth once daily for this concern.  It has been keeping her cholesterol levels stable and within goal.  She denies having any lower extremity edema, chest pain or pressure, shortness of breath, fatigue, activity intolerance, brain fog or arthralgias or myalgias.  Will continue patient's current medication therapy.

## 2022-12-14 NOTE — Assessment & Plan Note (Signed)
The patient has a [place] history of major depressive disorder and has been on MAOI medications for 20 years.  She is currently taking Parnate 10 mg tablet by mouth once daily.  States this medication works very well for her as she does not have any depressive symptoms and feels like she has never had depression while taking this medication.  Denies having any side effects from the medication.  With this being a [place]-term medication for her being so highly effective I have renewed the prescription for her.

## 2023-01-22 ENCOUNTER — Other Ambulatory Visit: Payer: Self-pay | Admitting: Family Medicine

## 2023-01-22 DIAGNOSIS — Z1231 Encounter for screening mammogram for malignant neoplasm of breast: Secondary | ICD-10-CM

## 2023-02-06 ENCOUNTER — Ambulatory Visit
Admission: RE | Admit: 2023-02-06 | Discharge: 2023-02-06 | Disposition: A | Discharge status: Home / Self Care | Payer: 59 | Source: Ambulatory Visit | Attending: Family Medicine | Admitting: Family Medicine

## 2023-02-06 DIAGNOSIS — Z1231 Encounter for screening mammogram for malignant neoplasm of breast: Secondary | ICD-10-CM | POA: Diagnosis present

## 2023-02-17 IMAGING — MG MM DIGITAL SCREENING BILAT W/ TOMO AND CAD
8 of 15 series · 8 of 40 positions shown · non-contrast
Comparison: Previous exam(s).

CLINICAL DATA: Screening.

EXAM:
DIGITAL SCREENING BILATERAL MAMMOGRAM WITH TOMOSYNTHESIS AND CAD
TECHNIQUE: Bilateral screening digital craniocaudal and mediolateral oblique
mammograms were obtained. Bilateral screening digital breast
tomosynthesis was performed. The images were evaluated with
computer-aided detection.

[L CC synth-2D]
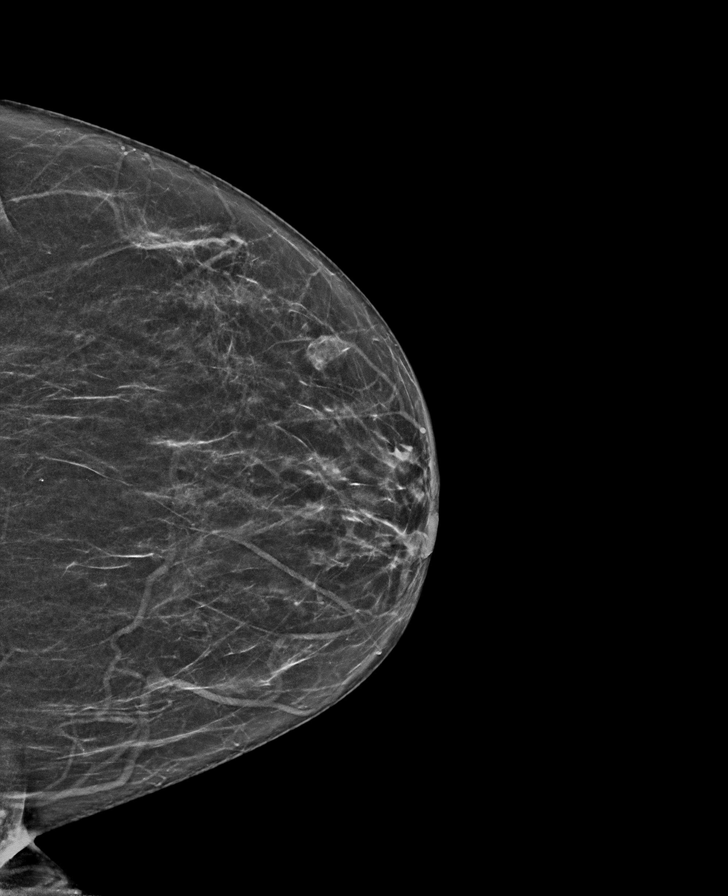

[L MLO synth-2D]
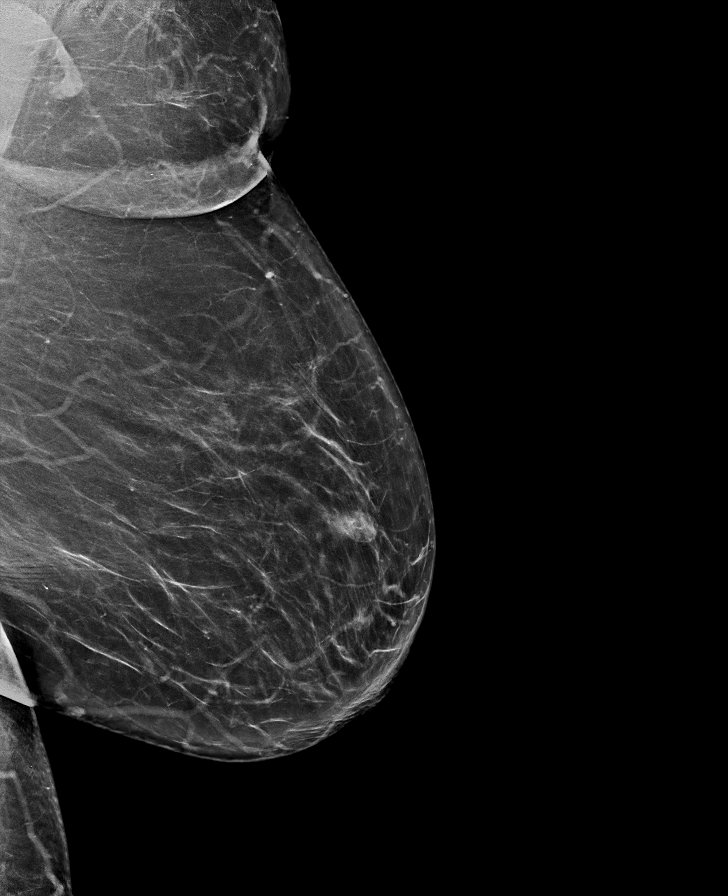

[R CC synth-2D (1 of 2)]
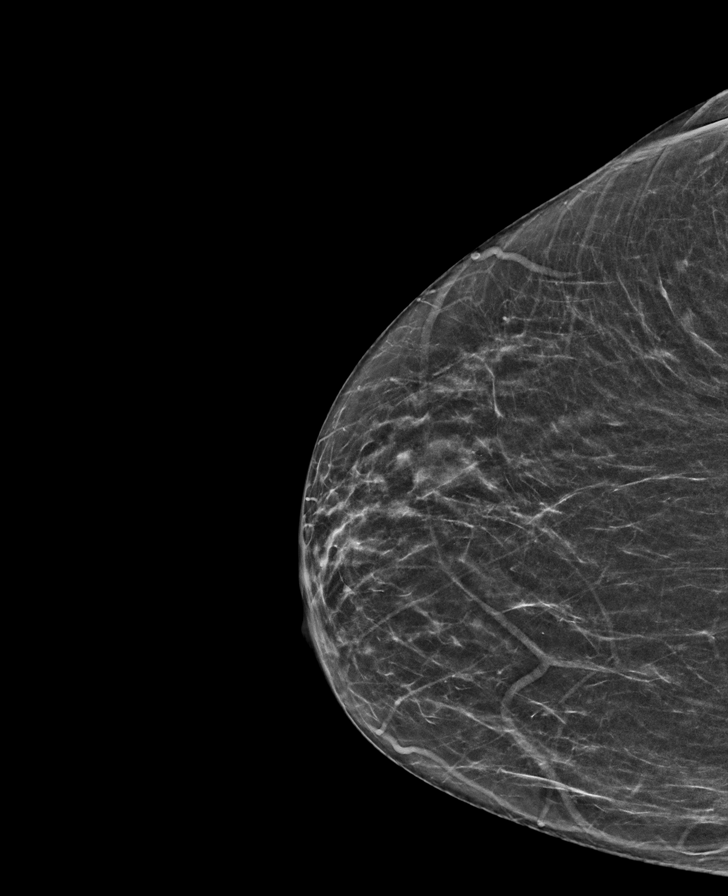

[R CC synth-2D (2 of 2)]
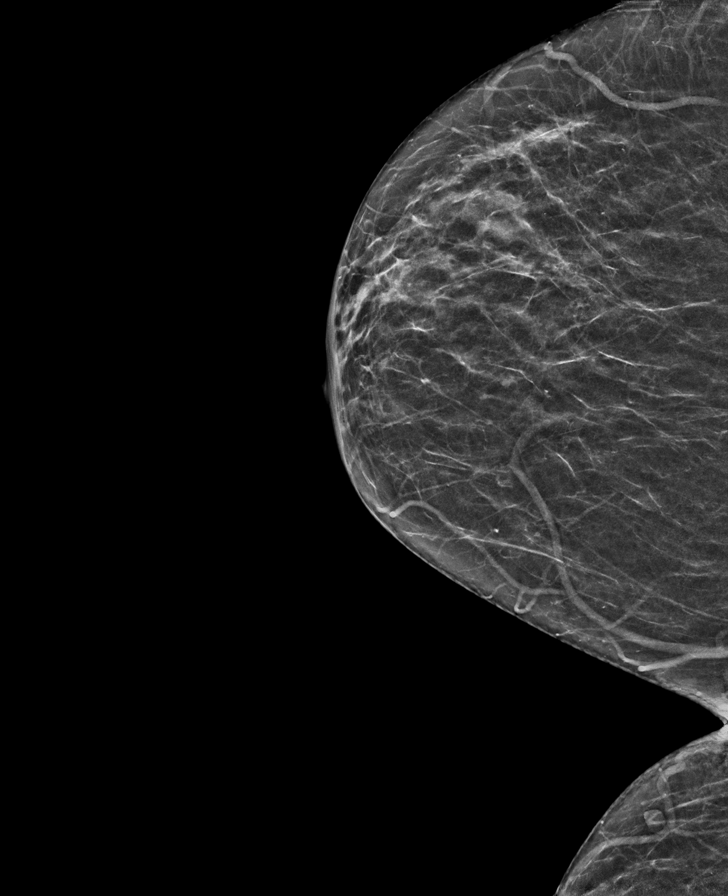

[R MLO synth-2D (1 of 3)]
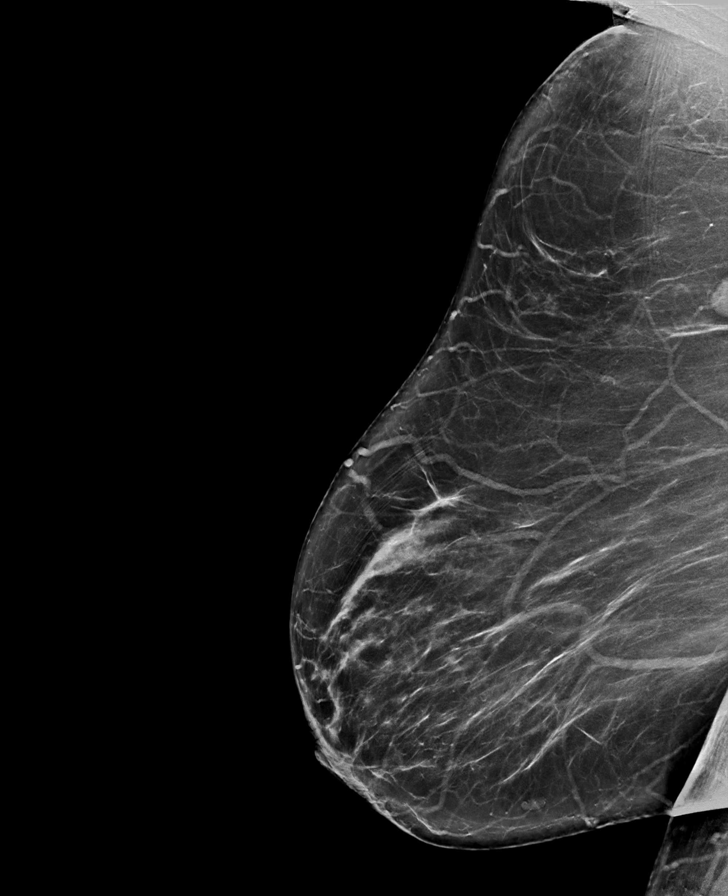

[R MLO synth-2D (2 of 3)]
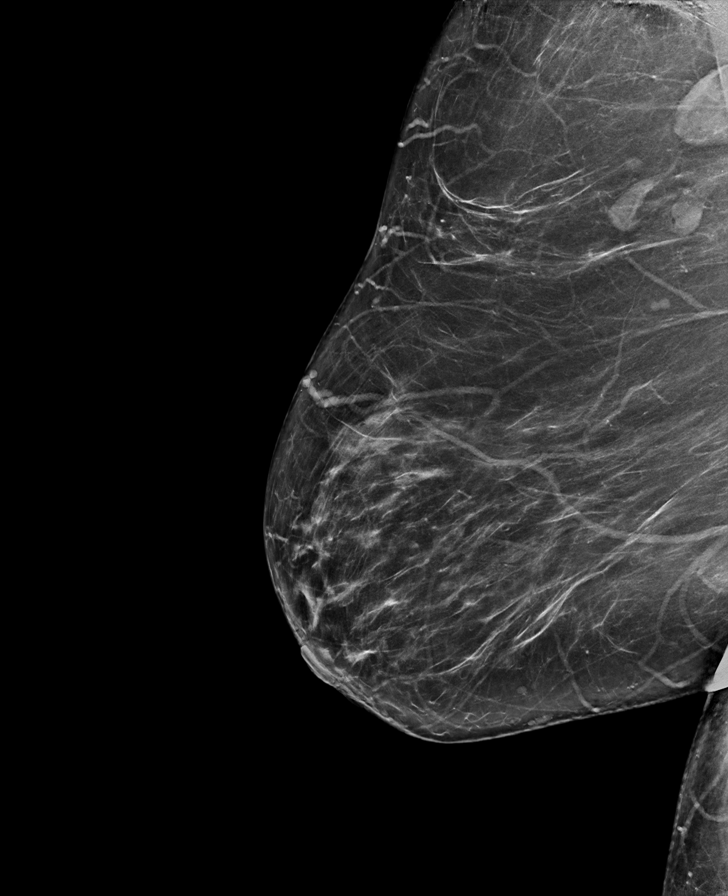

[R MLO synth-2D (3 of 3)]
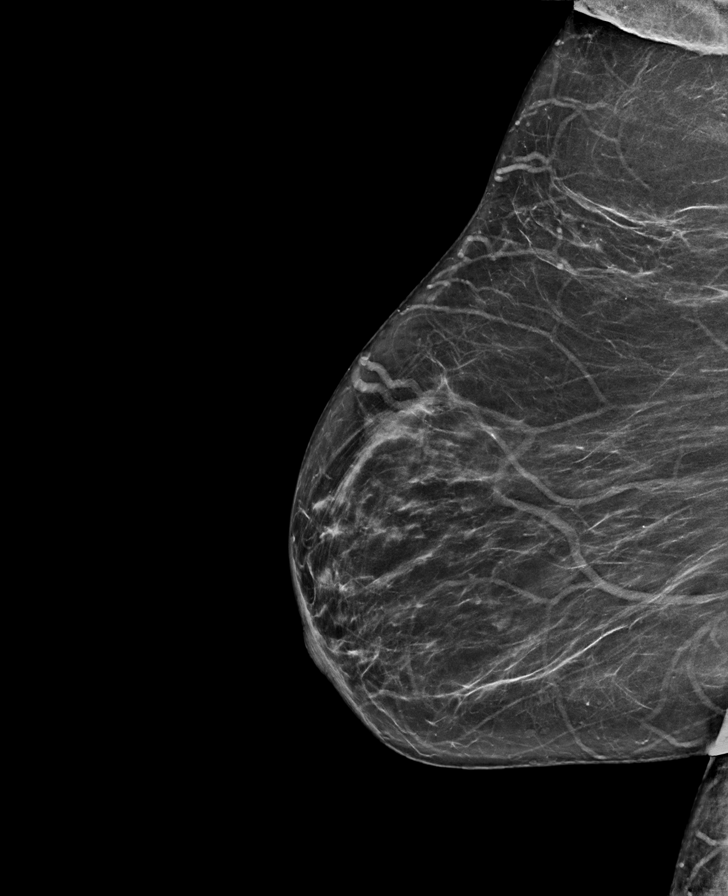

[R CC tomo · tomo slice 38/56.0]
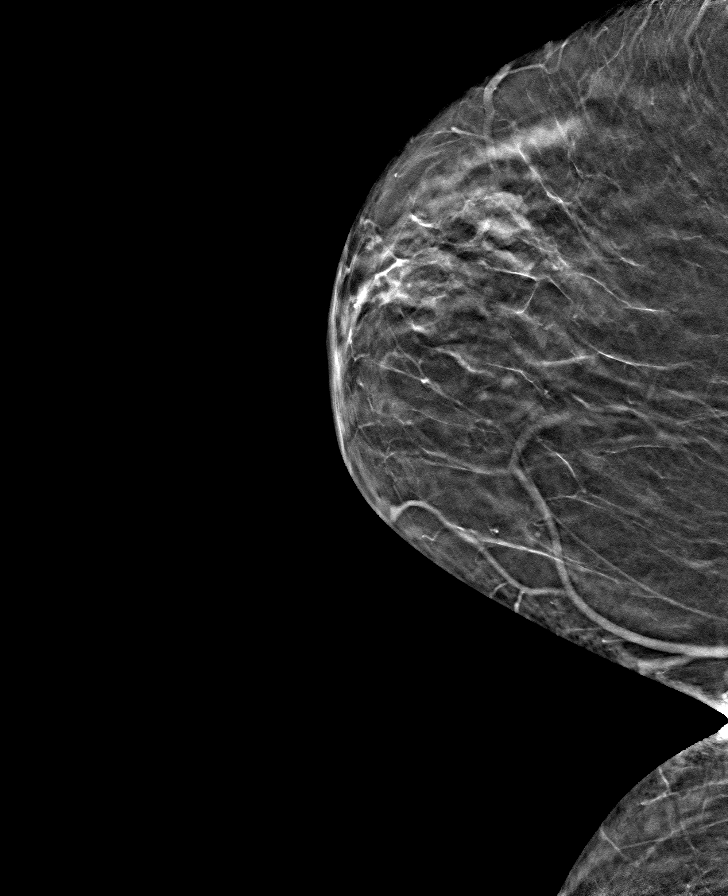

[8 of 40 positions shown; findings below may reference images not displayed]

ACR Breast Density Category b: There are scattered areas of
fibroglandular density.
FINDINGS: There are no findings suspicious for malignancy.
IMPRESSION: No mammographic evidence of malignancy. A result letter of this
screening mammogram will be mailed directly to the patient.

RECOMMENDATION:
Screening mammogram in one year. (Code:[id number]-[id number])

BI-RADS CATEGORY  1: Negative.

## 2023-04-05 NOTE — Telephone Encounter (Signed)
Per PCP -   Yes okay to order annual lab work for this patient.

## 2023-04-05 NOTE — Telephone Encounter (Signed)
Left message asking patient to call back to relay message below.

## 2023-04-05 NOTE — Telephone Encounter (Signed)
Patient called in and states she would like labs ordered. Last labs were 12-2021 by Dr.McMahon. She saw Victorino Dike in 11-2022 and would like them ordered with out coming in. Ok to order?    Patient also needs a flu shot scheduled.

## 2023-04-11 NOTE — Telephone Encounter (Signed)
Can this be closed ?

## 2023-07-02 NOTE — Telephone Encounter (Signed)
Patient called in stating that she saw JG on 12/14/2022. Patient is taking tranylcypromine and  lovastatin. Okay to fill until she sees [place] on 10/2023

## 2023-07-03 MED ORDER — tranylcypromine (PARNATE) 10 mg tablet
10 | ORAL_TABLET | ORAL | 1 refills | Status: DC
Start: 2023-07-03 — End: 2023-12-26

## 2023-07-03 MED ORDER — lovastatin (MEVACOR) 40 mg tablet
40 | ORAL_TABLET | ORAL | 1 refills | Status: DC
Start: 2023-07-03 — End: 2023-12-26

## 2023-07-03 NOTE — Telephone Encounter (Signed)
Emiliano Dyer, FNP  [facility] [address] [place] [facility] Ma12 hours ago (7:52 PM)       Yes, it is okay to refill these meds until she establishes with Dr. Tyson Alias

## 2023-07-03 NOTE — Telephone Encounter (Signed)
[name] is aware. She confirmed [facility].

## 2023-07-03 NOTE — Addendum Note (Signed)
Addended byArdyth Harps on: 07/03/2023 08:30 AM     Modules accepted: Orders

## 2023-09-07 ENCOUNTER — Encounter: Payer: MEDICARE | Primary: FNP

## 2023-10-12 ENCOUNTER — Inpatient Hospital Stay: Admit: 2023-10-12 | Payer: MEDICARE | Attending: FNP | Primary: FNP

## 2023-10-12 DIAGNOSIS — Z1231 Encounter for screening mammogram for malignant neoplasm of breast: Secondary | ICD-10-CM

## 2023-10-16 NOTE — Telephone Encounter (Signed)
 Spoke to patient verbalized good understanding.

## 2023-10-16 NOTE — Telephone Encounter (Signed)
-----   Message from [facility] sent at 10/16/2023  3:02 PM PDT -----  Your mammogram has come back as normal.  We will repeat screening in 1 year.

## 2023-10-19 ENCOUNTER — Ambulatory Visit: Payer: MEDICARE | Primary: FNP

## 2023-10-19 NOTE — Progress Notes (Unsigned)
 [name] is a 73 y.o. female.  Chief Complaint   Patient presents with    Establish Care        HISTORY OF PRESENT ILLNESS  HPI    Patient presents in office today to establish care, previously being followed by Dr. Fermin.  Overall feeling well however does report that     Chronic conditions, medications, social history, surgical history and family history reviewed with patient and updated in chart.       There were no vitals taken for this visit.  There is no height or weight on file to calculate BMI.    Patient Active Problem List   Diagnosis SNOMED CT(R)    Mixed hyperlipidemia MIXED HYPERLIPIDEMIA    Recurrent major depressive disorder, in partial remission (HCC) RECURRENT MAJOR DEPRESSION IN PARTIAL REMISSION    Vitamin D deficiency VITAMIN D DEFICIENCY    BMI 35.0-35.9,adult BODY MASS INDEX 30+ - OBESITY    Screening for colon cancer PATIENT ENCOUNTER STATUS    Anxiety attack ANXIETY ATTACK    Back strain STRAIN OF BACK MUSCLE    Cigarette smoker CIGARETTE SMOKER    Depression DEPRESSIVE DISORDER    GERD (gastroesophageal reflux disease) GASTROESOPHAGEAL REFLUX DISEASE    Memory loss AMNESIA    Hyperlipidemia HYPERLIPIDEMIA    Other reasons for seeking consultation FOLLOW-UP STATUS    Medicare annual wellness visit, subsequent PATIENT ENCOUNTER STATUS      Past Surgical History:   Procedure Laterality Date    BREAST CYST ASPIRATION Left     DILATION AND CURETTAGE OF UTERUS      PONV after dental      PROCEDURE Left 11/03/2011    Procedure: OPEN REDUCTION INTERNAL FIXATION, BIMALLEOLAR  ANKLE;  Surgeon: Charlie Wilber Balloon, MD;  Location: [facility] OR;  Service: Orthopedics;  Laterality: Left;    tonsils       Allergies   Allergen Reactions    Nitrous Oxide Nausea And Vomiting    Codeine Nausea And Vomiting     Social History     Socioeconomic History    Marital status: Married     Spouse name: Ellender    Number of children: 4   Tobacco Use    Smoking status: Former     Current packs/day: 0.00     Average  packs/day: 0.5 packs/day for 31.0 years (15.5 ttl pk-yrs)     Types: Cigarettes     Start date: 89     Quit date: 06/02/2018     Years since quitting: 5.3    Smokeless tobacco: Never   Substance and Sexual Activity    Alcohol use: No    Drug use: No    Sexual activity: Yes     Partners: Male   Other Topics Concern    Caffeine Concern Yes     Comment: occasional Pepsi    Exercise No     Current Outpatient Medications on File Prior to Visit   Medication Sig Dispense Refill    cholecalciferol , vitamin D3, 5,000 unit capsule Take 1 capsule by mouth daily. 1 capsule 0    lovastatin  (MEVACOR ) 40 mg tablet TAKE 1 TABLET BY MOUTH EVERY NIGHT AT BEDTIME 90 tablet 1    tranylcypromine  (PARNATE ) 10 mg tablet TAKE 2 TABLETS BY MOUTH IN THE MORNING AND 3 EVERY NIGHT AT BEDTIME 450 tablet 1     No current facility-administered medications on file prior to visit.       REVIEW OF SYSTEMS  Review of Systems    PHYSICAL EXAM  No results found.    Physical Exam      ASSESSMENT & PLAN  No diagnosis found.   No problem-specific Assessment & Plan notes found for this encounter.      ***    No follow-ups on file.    This note was transcribed using speech recognition software. Please contact us  for clarification if any questions arise relating to the wording of this document.    Rosina Hummer, MA

## 2023-10-24 ENCOUNTER — Encounter: Payer: MEDICARE | Primary: FNP

## 2023-11-14 ENCOUNTER — Other Ambulatory Visit: Payer: Self-pay | Admitting: Family

## 2023-12-26 ENCOUNTER — Ambulatory Visit: Admit: 2023-12-26 | Discharge: 2023-12-30 | Payer: MEDICARE

## 2023-12-26 VITALS — BP 138/54 | HR 72 | Temp 97.90000°F | Ht 62.25 in | Wt 157.2 lb

## 2023-12-26 DIAGNOSIS — E782 Mixed hyperlipidemia: Principal | ICD-10-CM

## 2023-12-26 MED ORDER — tranylcypromine (PARNATE) 10 mg tablet
10 | ORAL_TABLET | ORAL | 3 refills | Status: AC
Start: 2023-12-26 — End: ?

## 2023-12-26 MED ORDER — lovastatin (MEVACOR) 40 mg tablet
40 | ORAL_TABLET | Freq: Every evening | ORAL | 3 refills | 90.00000 days | Status: AC
Start: 2023-12-26 — End: ?

## 2023-12-26 NOTE — Assessment & Plan Note (Signed)
 This acute, stable.  Patient notes ongoing numbness/tingling in bilateral arms that has been ongoing for the last couple months.  She denies any pain associated with this.  No trauma or injury to the area, denies any cervical radiculopathy.  Vital signs stable, physical exam overall benign.  Spurling testing negative, Tinel's negative, strength and sensation overall intact.  Discussed with patient today and will obtain EMG studies to further evaluate as this could be early carpal tunnel syndrome.  Follow-up pending EMG results.  Can consider a trial of Lyrica or gabapentin in the future.

## 2023-12-26 NOTE — Assessment & Plan Note (Signed)
 History of prediabetes, last A1c 6.1% and she is due for repeat A1c which has already been ordered by prior provider.  Will have patient obtain labs for monitoring, continue with lifestyle modifications.

## 2023-12-26 NOTE — Progress Notes (Signed)
 [name] is a 73 y.o. female.  Chief Complaint   Patient presents with    Establish Care    Numbness        HISTORY OF PRESENT ILLNESS  HPI    Patient presents today to transfer care. Previously following w/ Delon Molly, NP.  Chronic conditions and medications reviewed with patient and updated in chart.  Patient states that she has been having some intermittent numbness and tingling in both hands and left arm, not constant.  Worse in the morning.  No associated pain.  Symptoms have been ongoing for the last couple months.    BP 138/54 (BP Location: Left arm, Patient Position: Sitting)   Pulse 72   Temp 36.6 ?C (97.9 ?F) (Oral)   Ht 5' 2.25 (1.581 m)   Wt 157 lb 3.2 oz (71.3 kg)   SpO2 96%   BMI 28.52 kg/m?   Body mass index is 28.52 kg/m?SABRA    Problem List[1]   Past Surgical History:   Procedure Laterality Date    BREAST CYST ASPIRATION Left     DILATION AND CURETTAGE OF UTERUS      PONV after dental      PROCEDURE Left 11/03/2011    Procedure: OPEN REDUCTION INTERNAL FIXATION, BIMALLEOLAR  ANKLE;  Surgeon: Charlie Wilber Balloon, MD;  Location: [facility] OR;  Service: Orthopedics;  Laterality: Left;    tonsils       Allergies[2]  Social History[3]  Medications Ordered Prior to Encounter[4]    REVIEW OF SYSTEMS  Review of Systems   All other systems reviewed and are negative.  See HPI above    PHYSICAL EXAM  No results found.    Physical Exam  Vitals and nursing note reviewed.   Constitutional:       Appearance: Normal appearance. She is not ill-appearing.      Comments: Pleasant on exam   HENT:      Head: Normocephalic and atraumatic.      Nose: Nose normal.      Mouth/Throat:      Mouth: Mucous membranes are moist.   Eyes:      Extraocular Movements: Extraocular movements intact.      Conjunctiva/sclera: Conjunctivae normal.   Cardiovascular:      Rate and Rhythm: Normal rate and regular rhythm.      Pulses: Normal pulses.      Heart sounds: Normal heart sounds.   Pulmonary:      Effort: Pulmonary effort  is normal.      Breath sounds: Normal breath sounds. No wheezing, rhonchi or rales.   Abdominal:      General: Bowel sounds are normal.      Palpations: Abdomen is soft.      Tenderness: There is no abdominal tenderness.   Musculoskeletal:      Cervical back: Normal range of motion.      Right lower leg: No edema.      Left lower leg: No edema.      Comments: Strength and sensation intact in bilateral upper extremities, grip strength normal.  Tinel's testing negative.  Spurling testing negative bilaterally.   Skin:     General: Skin is warm.   Neurological:      General: No focal deficit present.      Mental Status: She is alert.   Psychiatric:         Mood and Affect: Mood normal.         Behavior: Behavior normal.  ASSESSMENT & PLAN    ICD-10-CM    1. Mixed hyperlipidemia  E78.2 lovastatin  (MEVACOR ) 40 mg tablet      2. Vitamin D deficiency  E55.9       3. Prediabetes  R73.03       4. Numbness and tingling in both hands  R20.0 [facility]    R20.2       5. Recurrent major depressive disorder, in partial remission  F33.41 tranylcypromine  (PARNATE ) 10 mg tablet      6. Post-menopausal  Z78.0 DXA bone density axial skeleton         Mixed hyperlipidemia   Chronic, stable with use of lovastatin  40 mg nightly and tolerating well.  She is due for routine labs of which have been ordered.  Will have patient complete labs, continue lovastatin , refill provided.  Follow-up annually    Vitamin D deficiency   Chronic, stable with daily D3 supplement of 5000 her national units.  Will continue current regimen and obtain a repeat vitamin D level.    Prediabetes  History of prediabetes, last A1c 6.1% and she is due for repeat A1c which has already been ordered by prior provider.  Will have patient obtain labs for monitoring, continue with lifestyle modifications.    Recurrent major depressive disorder, in partial remission  Chronic, overall stable with use of Parnate  10 mg in which patient takes 2 tablets in  the morning and 3 tablets at bedtime.  Refill provided today.    Numbness and tingling in both hands   This acute, stable.  Patient notes ongoing numbness/tingling in bilateral arms that has been ongoing for the last couple months.  She denies any pain associated with this.  No trauma or injury to the area, denies any cervical radiculopathy.  Vital signs stable, physical exam overall benign.  Spurling testing negative, Tinel's negative, strength and sensation overall intact.  Discussed with patient today and will obtain EMG studies to further evaluate as this could be early carpal tunnel syndrome.  Follow-up pending EMG results.  Can consider a trial of Lyrica or gabapentin in the future.      I addressed medical care services that are part of ongoing care related to a patient's single, serious condition or a complex condition OR serve as the continuing focal point for all needed health care services.     Return in about 1 year (around 12/25/2024) for Annual exam .    This note was transcribed using speech recognition software. Please contact us  for clarification if any questions arise relating to the wording of this document.    Nat Glaze, MD          [1]   Patient Active Problem List  Diagnosis SNOMED CT(R)    Mixed hyperlipidemia MIXED HYPERLIPIDEMIA    Recurrent major depressive disorder, in partial remission RECURRENT MAJOR DEPRESSION IN PARTIAL REMISSION    Vitamin D deficiency VITAMIN D DEFICIENCY    BMI 35.0-35.9,adult BODY MASS INDEX 30+ - OBESITY    Anxiety attack ANXIETY ATTACK    Back strain STRAIN OF BACK MUSCLE    Cigarette smoker CIGARETTE SMOKER    GERD (gastroesophageal reflux disease) GASTROESOPHAGEAL REFLUX DISEASE    Memory loss AMNESIA    Other reasons for seeking consultation FOLLOW-UP STATUS    Medicare annual wellness visit, subsequent PATIENT ENCOUNTER STATUS    Prediabetes PREDIABETES    Numbness and tingling in both hands PARESTHESIA OF HAND   [2]   Allergies  Allergen Reactions     Nitrous Oxide Nausea And Vomiting    Codeine Nausea And Vomiting   [3]   Social History  Socioeconomic History    Marital status: Married     Spouse name: Ellender    Number of children: 4   Tobacco Use    Smoking status: Former     Current packs/day: 0.00     Average packs/day: 0.5 packs/day for 31.0 years (15.5 ttl pk-yrs)     Types: Cigarettes     Start date: 59     Quit date: 06/02/2018     Years since quitting: 5.5    Smokeless tobacco: Never   Substance and Sexual Activity    Alcohol use: No    Drug use: No    Sexual activity: Yes     Partners: Male   Other Topics Concern    Caffeine Concern Yes     Comment: occasional Pepsi    Exercise No   [4]   Current Outpatient Medications on File Prior to Visit   Medication Sig Dispense Refill    cholecalciferol , vitamin D3, 5,000 unit capsule Take 1 capsule by mouth daily. 1 capsule 0    [DISCONTINUED] lovastatin  (MEVACOR ) 40 mg tablet TAKE 1 TABLET BY MOUTH EVERY NIGHT AT BEDTIME 90 tablet 1    [DISCONTINUED] tranylcypromine  (PARNATE ) 10 mg tablet TAKE 2 TABLETS BY MOUTH IN THE MORNING AND 3 EVERY NIGHT AT BEDTIME 450 tablet 1     No current facility-administered medications on file prior to visit.

## 2023-12-26 NOTE — Assessment & Plan Note (Signed)
 Chronic, stable with daily D3 supplement of 5000 her national units.  Will continue current regimen and obtain a repeat vitamin D level.

## 2023-12-26 NOTE — Assessment & Plan Note (Signed)
 Chronic, stable with use of lovastatin  40 mg nightly and tolerating well.  She is due for routine labs of which have been ordered.  Will have patient complete labs, continue lovastatin , refill provided.  Follow-up annually

## 2023-12-26 NOTE — Assessment & Plan Note (Addendum)
 Chronic, overall stable with use of Parnate  10 mg in which patient takes 2 tablets in the morning and 3 tablets at bedtime.  Refill provided today.

## 2024-01-21 ENCOUNTER — Other Ambulatory Visit: Payer: Self-pay | Admitting: Family Medicine

## 2024-01-21 DIAGNOSIS — Z1231 Encounter for screening mammogram for malignant neoplasm of breast: Secondary | ICD-10-CM

## 2024-02-07 ENCOUNTER — Ambulatory Visit
Admission: RE | Admit: 2024-02-07 | Discharge: 2024-02-07 | Disposition: A | Discharge status: Home / Self Care | Source: Ambulatory Visit | Attending: Family Medicine | Admitting: Family Medicine

## 2024-02-07 DIAGNOSIS — Z1231 Encounter for screening mammogram for malignant neoplasm of breast: Secondary | ICD-10-CM | POA: Diagnosis present

## 2024-03-14 ENCOUNTER — Other Ambulatory Visit: Payer: Self-pay | Admitting: Family Medicine

## 2024-03-14 DIAGNOSIS — Z780 Asymptomatic menopausal state: Secondary | ICD-10-CM

## 2024-03-18 ENCOUNTER — Other Ambulatory Visit: Payer: Self-pay

## 2024-03-18 DIAGNOSIS — Z122 Encounter for screening for malignant neoplasm of respiratory organs: Secondary | ICD-10-CM

## 2024-03-18 DIAGNOSIS — Z87891 Personal history of nicotine dependence: Secondary | ICD-10-CM

## 2024-04-03 ENCOUNTER — Telehealth: Payer: Self-pay | Admitting: Acute Care

## 2024-04-03 ENCOUNTER — Encounter: Payer: Self-pay | Admitting: Pulmonary Disease

## 2024-04-03 ENCOUNTER — Ambulatory Visit (INDEPENDENT_AMBULATORY_CARE_PROVIDER_SITE_OTHER): Admitting: Pulmonary Disease

## 2024-04-03 VITALS — BP 124/74 | HR 64 | Temp 97.4°F | Ht 59.0 in | Wt 200.2 lb

## 2024-04-03 DIAGNOSIS — I50810 Right heart failure, unspecified: Secondary | ICD-10-CM | POA: Diagnosis not present

## 2024-04-03 DIAGNOSIS — R0602 Shortness of breath: Secondary | ICD-10-CM

## 2024-04-03 MED ORDER — UMECLIDINIUM-VILANTEROL 62.5-25 MCG/ACT IN AEPB: 1.0000 | INHALATION_SPRAY | Freq: Every day | RESPIRATORY_TRACT | 6 refills | Status: DC

## 2024-04-03 NOTE — Telephone Encounter (Signed)
 Spoke with patient regarding scheduling her past due [facility] and Dr. Malka was requesting we make her an appointment.  Patient states she cannot schedule today and asked for a call tomorrow.

## 2024-04-03 NOTE — Progress Notes (Signed)
 Synopsis: Referred in by Lorel Maxie LABOR, MD   Subjective:   PATIENT ID: Robin [name] GENDER: female DOB: 1950-08-06, MRN: [id number]  Chief Complaint  Patient presents with   Consult    DOE. Occasional wheezing and cough.  Using Albuterol  Once a day.    HPI Discussed the use of AI scribe software for clinical note transcription with the patient, who gave verbal consent to proceed.  History of Present Illness   Robin [name] is a 73 year old female with right ventricular failure who presents with chronic shortness of breath.   She has experienced trouble breathing for the past two to three years and has been on oxygen for about the same duration, though not continuously. She manages her symptoms by resting when short of breath and uses an albuterol  inhaler daily for occasional chest tightness and wheezing. No cough or phlegm is present. She notes increased shortness of breath when walking uphill but can walk to the mailbox and back once or twice a day.  Her sleep is characterized by some snoring and occasional waking up feeling tired. She prefers sleeping on her side, although she can sleep on her back without difficulty.  Her past medical history includes a significant smoking history of over a pack a day for approximately thirty years, which she quit in 2023. She had an echocardiogram in 2023 that showed a normal EF but did show moderate RV failure. She was told by her heart specialist to watch her weight and make sure she did not get fluid around her heart; she is currently on Lasix  for fluid management.  Family history is notable for her father having emphysema and her brother having had blood clots, which were treated.  No joint issues, swallowing issues, cold hands, or cold feet.     Family History  Problem Relation Age of Onset   Breast cancer Sister 59     Social History   Socioeconomic History   Marital status: Single    Spouse name: Not on file   Number of children:  Not on file   Years of education: Not on file   Highest education level: Not on file  Occupational History   Not on file  Tobacco Use   Smoking status: Former    Current packs/day: 1.00    Average packs/day: 1 pack/day for 47.0 years (47.0 ttl pk-yrs)    Types: Cigarettes   Smokeless tobacco: Never   Tobacco comments:    Quit smoking in 2023        Started smoking at 74 years old    Smoked 1PPD at her heaviest  Substance and Sexual Activity   Alcohol use: Yes    Comment: rare   Drug use: No   Sexual activity: Not on file  Other Topics Concern   Not on file  Social History Narrative   Not on file   Social Drivers of Health   Financial Resource Strain: Not on file  Food Insecurity: Not on file  Transportation Needs: Not on file  Physical Activity: Not on file  Stress: Not on file  Social Connections: Not on file  Intimate Partner Violence: Not on file        Objective:   Vitals:   04/03/24 1001  BP: 124/74  Pulse: 64  Temp: (!) 97.4 F (36.3 C)  SpO2: 97%  Weight: 200 lb 3.2 oz (90.8 kg)  Height: 4' 11 (1.499 m)   97% on RA BMI Readings from  Last 3 Encounters:  04/03/24 40.44 kg/m  10/02/22 40.27 kg/m  01/12/22 39.18 kg/m   Wt Readings from Last 3 Encounters:  04/03/24 200 lb 3.2 oz (90.8 kg)  10/02/22 199 lb 6 oz (90.4 kg)  01/12/22 194 lb (88 kg)    Physical Exam GEN: NAD, Obese HEENT: Supple Neck, Reactive Pupils, EOMI  CVS: Normal S1, Normal S2, RRR, No murmurs or ES appreciated  Lungs: Diminished air entry at the bases.  Abdomen: Soft, non tender, non distended, + BS  Extremities: Warm and well perfused, No edema   Labs and imaging were reviewed.   Ancillary Information   CBC    Component Value Date/Time   WBC 9.0 12/27/2021 1502   WBC 14.0 (H) 07/23/2021 0433   RBC 5.85 (H) 12/27/2021 1502   RBC 5.00 07/23/2021 0433   HGB 10.8 (L) 12/27/2021 1502   HCT 37.2 12/27/2021 1502   PLT 471 (H) 12/27/2021 1502   MCV 64 (L)  12/27/2021 1502   MCV 74 (L) 08/08/2013 0616   MCH 18.5 (L) 12/27/2021 1502   MCH 18.8 (L) 07/23/2021 0433   MCHC 29.0 (L) 12/27/2021 1502   MCHC 27.4 (L) 07/23/2021 0433   RDW 20.5 (H) 12/27/2021 1502   RDW 14.4 08/08/2013 0616   LYMPHSABS 1.5 07/21/2021 1052   LYMPHSABS 1.7 08/08/2013 0616   MONOABS 0.8 07/21/2021 1052   MONOABS 1.1 (H) 08/08/2013 0616   EOSABS 0.0 07/21/2021 1052   EOSABS 0.1 08/08/2013 0616   BASOSABS 0.0 07/21/2021 1052   BASOSABS 0.1 08/08/2013 0616        No data to display           Assessment & Plan:  Assessment and Plan    #Chronic obstructive pulmonary disease (COPD) Suspected COPD due to smoking history and symptoms of exertional dyspnea. Daily albuterol  use noted. - Order chest X-ray to evaluated for pleural effusion as significantly diminished air entry at the bases.  - Order pulmonary function test. - Prescribe Anoro inhaler, one puff once daily.  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  - Repeat echocardiogram. - CXR to evaluate for effusions.  - HST - PFTs - Refer to cardiology   #Lung cancer screening surveillance Lung cancer screening program enrollment needed due to smoking history. Last CT in 2022. - Ensure re-enrollment in lung cancer screening program.  #Potential sleep apnea Snoring and occasional apneic episodes. Fatigue during the day. Home study preferred. - Order home sleep study.     Return in about 3 months (around 07/04/2024).  I personally spent a total of 60 minutes in the care of the patient today including preparing to see the patient, getting/reviewing separately obtained history, performing a medically appropriate exam/evaluation, counseling and educating, placing orders, documenting clinical information in the EHR, independently interpreting results, and communicating results.   Darrin Barn, MD [facility] 04/03/2024 10:39 AM

## 2024-04-14 ENCOUNTER — Ambulatory Visit

## 2024-04-21 NOTE — Progress Notes (Deleted)
  Cardiology Office Note   Date:  04/21/2024  ID:  Robin [name], DOB 1951-06-07, MRN [id number] PCP: Lorel Maxie DELENA, MD  [facility] Providers Cardiologist:  None { Click to update primary MD,subspecialty MD or APP then REFRESH:1}    History of Present Illness Robin [name] is a 73 y.o. female PMH COPD, HTN, HLD, DM 2 who presents for further evaluation and management of right sided heart failure.  ***. No LDL on file.  Relevant CVD History -TTE 07/2021 LVEF 60 to 65% with grade 1 diastolic dysfunction.  RV moderately reduced.  No significant valvular disease. -CT chest 09/2020 aortic atherosclerosis and mild multivessel CAC   ROS: Pt denies any chest discomfort, jaw pain, arm pain, palpitations, syncope, presyncope, orthopnea, PND, or LE edema.  Studies Reviewed I have independently reviewed the patient's ECG, previous cardiac testing, recent blood work.  Physical Exam VS:  There were no vitals taken for this visit.       Wt Readings from Last 3 Encounters:  04/03/24 200 lb 3.2 oz (90.8 kg)  10/02/22 199 lb 6 oz (90.4 kg)  01/12/22 194 lb (88 kg)    GEN: No acute distress. NECK: No JVD; No carotid bruits. CARDIAC: ***RRR, no murmurs, rubs, gallops. RESPIRATORY:  Clear to auscultation. EXTREMITIES:  Warm and well-perfused. No edema.  ASSESSMENT AND PLAN RV failure Pulmonary hypertension COPD CAC Aortic atherosclerosis        {Are you ordering a CV Procedure (e.g. stress test, cath, DCCV, TEE, etc)?   Press F2        :[id number]}  Dispo: ***  Signed, Caron Poser, MD

## 2024-04-22 ENCOUNTER — Ambulatory Visit

## 2024-04-28 ENCOUNTER — Encounter

## 2024-04-28 DIAGNOSIS — I50810 Right heart failure, unspecified: Secondary | ICD-10-CM

## 2024-05-01 NOTE — Progress Notes (Signed)
  Cardiology Office Note   Date:  05/05/2024  ID:  ERENDIDA WRENN, DOB 1950/12/15, MRN [id number] PCP: Lorel Maxie DELENA, MD  [facility] Providers Cardiologist:  Caron Poser, MD     History of Present Illness Robin [name] is a 73 y.o. female PMH COPD, HLD, DM 2 who presents for further evaluation and management of RV failure seen on echocardiogram.  Patient reports she is overall feeling well.  She missed her recent echocardiogram appointment.  She has known COPD and follows with pulmonology.  She denies any chest discomfort.  Her dyspnea is chronic and stable per her report.  No LDL on file  Relevant CVD History -TTE 05/02/2024 pending (no show for appointment) -TTE 07/2021 LVEF 60 to 65%, grade 1 diastolic dysfunction, moderately reduced RV function - CT chest without contrast 09/2020 with aortic atherosclerosis and CAC   ROS: Pt denies any chest discomfort, jaw pain, arm pain, palpitations, syncope, presyncope, orthopnea, PND, or LE edema.  Studies Reviewed I have independently reviewed the patient's ECG, previous CT scan, previous cardiac testing, recent medical records.  Physical Exam VS:  BP 116/70 (BP Location: Left Arm, Patient Position: Sitting, Cuff Size: Normal)   Pulse 67   Ht 4' 11.5 (1.511 m)   Wt 197 lb (89.4 kg)   SpO2 96%   BMI 39.12 kg/m        Wt Readings from Last 3 Encounters:  05/05/24 197 lb (89.4 kg)  04/03/24 200 lb 3.2 oz (90.8 kg)  10/02/22 199 lb 6 oz (90.4 kg)    GEN: No acute distress. NECK: No JVD; No carotid bruits. CARDIAC: RRR, no murmurs, rubs, gallops. RESPIRATORY:  Clear to auscultation. EXTREMITIES:  Warm and well-perfused. No edema.  ASSESSMENT AND PLAN RV failure COPD Seen on prior echo.  Inadequate TR jet to estimate PA pressures.  She has not had any change in her stable chronic dyspnea.  If she does have pulmonary hypertension, this is likely secondary to COPD.  Plan: - Repeat echocardiogram; if continued signs of RV  dysfunction or elevated PASP, then we will pursue a right heart cath to further characterize  CAC Aortic atherosclerosis Noted on previous CT scans.  She does not have any recent lipid testing.  Her LDL goal is less than 70.  She does not have any angina or other symptoms concerning for obstructive CAD.  Plan: - Continue ASA 81 mg daily - Continue Lipitor 20 mg daily; we may need to uptitrate this based on LDL result        Dispo: RTC 6 months or sooner as needed  Signed, Caron Poser, MD

## 2024-05-02 ENCOUNTER — Ambulatory Visit: Admission: RE | Admit: 2024-05-02 | Source: Ambulatory Visit

## 2024-05-05 ENCOUNTER — Ambulatory Visit

## 2024-05-05 VITALS — BP 116/70 | HR 67 | Ht 59.5 in | Wt 197.0 lb

## 2024-05-05 DIAGNOSIS — I50810 Right heart failure, unspecified: Secondary | ICD-10-CM | POA: Diagnosis not present

## 2024-05-05 DIAGNOSIS — I2510 Atherosclerotic heart disease of native coronary artery without angina pectoris: Secondary | ICD-10-CM

## 2024-05-05 DIAGNOSIS — I700 Atherosclerosis of aorta: Secondary | ICD-10-CM | POA: Diagnosis not present

## 2024-05-05 NOTE — Patient Instructions (Signed)
 Medication Instructions:   Your physician recommends that you continue on your current medications as directed. Please refer to the Current Medication list given to you today.  *If you need a refill on your cardiac medications before your next appointment, please call your pharmacy*  Lab Work:  Your provider would like for you to have following labs drawn today Lipid.    If you have labs (blood work) drawn today and your tests are completely normal, you will receive your results only by: MyChart Message (if you have MyChart) OR A paper copy in the mail If you have any lab test that is abnormal or we need to change your treatment, we will call you to review the results.  Testing/Procedures:  Your physician has requested that you have an echocardiogram. Echocardiography is a painless test that uses sound waves to create images of your heart. It provides your doctor with information about the size and shape of your heart and how well your heart's chambers and valves are working.   You may receive an ultrasound enhancing agent through an IV if needed to better visualize your heart during the echo. This procedure takes approximately one hour.  There are no restrictions for this procedure.  This will take place at [address] [facility] [address], [place] [address]  Please note: We ask at that you not bring children with you during ultrasound (echo/ vascular) testing. Due to room size and safety concerns, children are not allowed in the ultrasound rooms during exams. Our front office staff cannot provide observation of children in our lobby area while testing is being conducted. An adult accompanying a patient to their appointment will only be allowed in the ultrasound room at the discretion of the ultrasound technician under special circumstances. We apologize for any inconvenience.   Follow-Up: At [facility], you and your health needs are our priority.  As part of  our continuing mission to provide you with exceptional heart care, our providers are all part of one team.  This team includes your primary Cardiologist (physician) and Advanced Practice Providers or APPs (Physician Assistants and Nurse Practitioners) who all work together to provide you with the care you need, when you need it.  Your next appointment:   6 month(s)  Provider:   Caron Poser, MD    We recommend signing up for the patient portal called MyChart.  Sign up information is provided on this After Visit Summary.  MyChart is used to connect with patients for Virtual Visits (Telemedicine).  Patients are able to view lab/test results, encounter notes, upcoming appointments, etc.  Non-urgent messages can be sent to your provider as well.   To learn more about what you can do with MyChart, go to [email or web address].

## 2024-05-06 ENCOUNTER — Ambulatory Visit: Payer: Self-pay

## 2024-05-06 LAB — LIPID PANEL
Chol/HDL Ratio: 2.6 ratio (ref 0.0–4.4)
Cholesterol, Total: 115 mg/dL (ref 100–199)
HDL: 45 mg/dL (ref >39)
LDL Chol Calc (NIH): 52 mg/dL (ref 0–99)
Triglycerides: 92 mg/dL (ref 0–149)
VLDL Cholesterol Cal: 18 mg/dL (ref 5–40)

## 2024-05-21 DIAGNOSIS — R0683 Snoring: Secondary | ICD-10-CM | POA: Diagnosis not present

## 2024-05-21 DIAGNOSIS — I50810 Right heart failure, unspecified: Secondary | ICD-10-CM | POA: Diagnosis not present

## 2024-06-20 ENCOUNTER — Ambulatory Visit: Attending: Pulmonary Disease

## 2024-06-20 DIAGNOSIS — I50810 Right heart failure, unspecified: Secondary | ICD-10-CM

## 2024-06-20 LAB — ECHOCARDIOGRAM COMPLETE
AR max vel: 1.68 cm2
AV Area VTI: 1.65 cm2
AV Area mean vel: 1.64 cm2
AV Mean grad: 6 mmHg
AV Peak grad: 12.3 mmHg
Ao pk vel: 1.75 m/s
Area-P 1/2: 3.54 cm2
MV VTI: 2.24 cm2
S' Lateral: 2.21 cm
